# Patient Record
Sex: Female | Born: 1980 | Hispanic: Yes | Marital: Married | Smoking: Never smoker
Health system: Southern US, Community
[De-identification: ages and names within clinical notes are randomized; demographics above are authoritative.]

## PROBLEM LIST (undated history)

## (undated) DIAGNOSIS — Z789 Other specified health status: Secondary | ICD-10-CM

## (undated) DIAGNOSIS — O139 Gestational [pregnancy-induced] hypertension without significant proteinuria, unspecified trimester: Secondary | ICD-10-CM

## (undated) HISTORY — DX: Gestational (pregnancy-induced) hypertension without significant proteinuria, unspecified trimester: O13.9

---
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
## 2017-07-08 LAB — CYTOLOGY - PAP: PAP SMEAR: NEGATIVE

## 2017-09-26 ENCOUNTER — Ambulatory Visit: Payer: Self-pay | Admitting: *Deleted

## 2017-09-26 ENCOUNTER — Encounter: Payer: Self-pay | Admitting: General Practice

## 2017-09-26 ENCOUNTER — Other Ambulatory Visit: Payer: Self-pay

## 2017-09-26 VITALS — BP 124/69 | HR 69 | Temp 98.1°F | Ht 62.0 in | Wt 138.0 lb

## 2017-09-26 DIAGNOSIS — Z3200 Encounter for pregnancy test, result unknown: Secondary | ICD-10-CM

## 2017-09-26 DIAGNOSIS — Z3201 Encounter for pregnancy test, result positive: Secondary | ICD-10-CM

## 2017-09-26 LAB — POCT URINE PREGNANCY: Preg Test, Ur: POSITIVE — AB

## 2017-09-26 NOTE — Progress Notes (Signed)
   Ms. Greeley presents today for UPT. She has no unusual complaints. LMP: 07/27/2017    OBJECTIVE: Appears well, in no apparent distress.  OB History    Gravida  3   Para  2   Term  2   Preterm      AB      Living  2     SAB      TAB      Ectopic      Multiple      Live Births  2          Home UPT Result: Positive In-Office UPT result:  Postive I have reviewed the patient's medical, obstetrical, social, and family histories, and medications.   ASSESSMENT: Positive pregnancy test  PLAN Prenatal care to be completed at: [facility]  [place], [place], [place]

## 2017-10-23 ENCOUNTER — Other Ambulatory Visit (HOSPITAL_COMMUNITY): Payer: Self-pay | Admitting: Nurse Practitioner

## 2017-10-23 DIAGNOSIS — Z369 Encounter for antenatal screening, unspecified: Secondary | ICD-10-CM

## 2017-10-23 LAB — OB RESULTS CONSOLE RUBELLA ANTIBODY, IGM: RUBELLA: IMMUNE

## 2017-10-23 LAB — OB RESULTS CONSOLE PLATELET COUNT: PLATELETS: 231

## 2017-10-23 LAB — OB RESULTS CONSOLE HEPATITIS B SURFACE ANTIGEN: HEP B S AG: NEGATIVE

## 2017-10-23 LAB — OB RESULTS CONSOLE RPR: RPR: NONREACTIVE

## 2017-10-23 LAB — OB RESULTS CONSOLE HGB/HCT, BLOOD
HCT: 41 (ref 29–41)
Hemoglobin: 13.7

## 2017-10-23 LAB — OB RESULTS CONSOLE ABO/RH: RH Type: POSITIVE

## 2017-10-23 LAB — OB RESULTS CONSOLE ANTIBODY SCREEN: ANTIBODY SCREEN: NEGATIVE

## 2017-10-23 LAB — OB RESULTS CONSOLE HIV ANTIBODY (ROUTINE TESTING): HIV: NONREACTIVE

## 2017-10-23 LAB — CYSTIC FIBROSIS DIAGNOSTIC STUDY: Interpretation-CFDNA:: NEGATIVE

## 2017-10-23 LAB — OB RESULTS CONSOLE VARICELLA ZOSTER ANTIBODY, IGG: VARICELLA IGG: IMMUNE

## 2017-10-23 LAB — OB RESULTS CONSOLE GC/CHLAMYDIA
Chlamydia: NEGATIVE
Gonorrhea: NEGATIVE

## 2017-10-23 LAB — CULTURE, OB URINE: URINE CULTURE, OB: NO GROWTH

## 2017-10-23 LAB — SICKLE CELL SCREEN: SICKLE CELL SCREEN: NORMAL

## 2017-10-24 ENCOUNTER — Encounter (HOSPITAL_COMMUNITY): Payer: Self-pay

## 2017-10-24 ENCOUNTER — Other Ambulatory Visit: Payer: Self-pay

## 2017-10-30 ENCOUNTER — Encounter (HOSPITAL_COMMUNITY): Payer: Self-pay | Admitting: *Deleted

## 2017-10-31 ENCOUNTER — Ambulatory Visit (HOSPITAL_COMMUNITY)
Admission: RE | Admit: 2017-10-31 | Discharge: 2017-10-31 | Disposition: A | Discharge status: Home / Self Care | Payer: Self-pay | Source: Ambulatory Visit | Attending: Nurse Practitioner | Admitting: Nurse Practitioner

## 2017-10-31 ENCOUNTER — Other Ambulatory Visit: Payer: Self-pay

## 2017-10-31 ENCOUNTER — Other Ambulatory Visit (HOSPITAL_COMMUNITY): Payer: Self-pay

## 2017-10-31 ENCOUNTER — Encounter (HOSPITAL_COMMUNITY): Payer: Self-pay

## 2017-10-31 ENCOUNTER — Ambulatory Visit (HOSPITAL_COMMUNITY)
Admission: RE | Admit: 2017-10-31 | Discharge: 2017-10-31 | Disposition: A | Discharge status: Home / Self Care | Payer: Self-pay | Source: Ambulatory Visit

## 2017-10-31 ENCOUNTER — Ambulatory Visit (HOSPITAL_COMMUNITY): Admission: RE | Admit: 2017-10-31 | Payer: Self-pay | Source: Ambulatory Visit

## 2017-10-31 DIAGNOSIS — Z3A13 13 weeks gestation of pregnancy: Secondary | ICD-10-CM | POA: Insufficient documentation

## 2017-10-31 DIAGNOSIS — Z369 Encounter for antenatal screening, unspecified: Secondary | ICD-10-CM

## 2017-10-31 DIAGNOSIS — O09521 Supervision of elderly multigravida, first trimester: Secondary | ICD-10-CM | POA: Insufficient documentation

## 2017-10-31 DIAGNOSIS — O34219 Maternal care for unspecified type scar from previous cesarean delivery: Secondary | ICD-10-CM | POA: Insufficient documentation

## 2017-10-31 DIAGNOSIS — O3621X0 Maternal care for hydrops fetalis, first trimester, not applicable or unspecified: Secondary | ICD-10-CM | POA: Insufficient documentation

## 2017-10-31 DIAGNOSIS — Z3682 Encounter for antenatal screening for nuchal translucency: Secondary | ICD-10-CM | POA: Insufficient documentation

## 2017-10-31 HISTORY — DX: Other specified health status: Z78.9

## 2017-11-03 ENCOUNTER — Other Ambulatory Visit (HOSPITAL_COMMUNITY): Payer: Self-pay | Admitting: *Deleted

## 2017-11-03 DIAGNOSIS — O358XX0 Maternal care for other (suspected) fetal abnormality and damage, not applicable or unspecified: Secondary | ICD-10-CM

## 2017-11-17 ENCOUNTER — Other Ambulatory Visit (HOSPITAL_COMMUNITY): Payer: Self-pay | Admitting: *Deleted

## 2017-11-17 ENCOUNTER — Ambulatory Visit (HOSPITAL_COMMUNITY)
Admission: RE | Admit: 2017-11-17 | Discharge: 2017-11-17 | Disposition: A | Discharge status: Home / Self Care | Payer: Self-pay | Source: Ambulatory Visit | Attending: Nurse Practitioner | Admitting: Nurse Practitioner

## 2017-11-17 ENCOUNTER — Encounter (HOSPITAL_COMMUNITY): Payer: Self-pay

## 2017-11-17 DIAGNOSIS — O34219 Maternal care for unspecified type scar from previous cesarean delivery: Secondary | ICD-10-CM

## 2017-11-17 DIAGNOSIS — O358XX0 Maternal care for other (suspected) fetal abnormality and damage, not applicable or unspecified: Secondary | ICD-10-CM

## 2017-11-17 DIAGNOSIS — O289 Unspecified abnormal findings on antenatal screening of mother: Secondary | ICD-10-CM

## 2017-11-17 DIAGNOSIS — O09522 Supervision of elderly multigravida, second trimester: Secondary | ICD-10-CM

## 2017-11-17 DIAGNOSIS — Z3A16 16 weeks gestation of pregnancy: Secondary | ICD-10-CM

## 2017-11-17 DIAGNOSIS — D181 Lymphangioma, any site: Secondary | ICD-10-CM | POA: Insufficient documentation

## 2017-11-19 ENCOUNTER — Other Ambulatory Visit (HOSPITAL_COMMUNITY): Payer: Self-pay

## 2017-11-19 ENCOUNTER — Telehealth (HOSPITAL_COMMUNITY): Payer: Self-pay | Admitting: Obstetrics and Gynecology

## 2017-11-19 NOTE — Telephone Encounter (Signed)
Called the patient and informed the FISH results of amniocentesis.  FISH: Abnormal, Trisomy 18.  I informed the patient of the significance of FISH results and that only final culture results are confirmatory. Decision to terminate the pregnancy should be, preferably, made after final culture results.  Patient was very upset after hearing the news. We will contact her after final results.

## 2017-11-25 ENCOUNTER — Other Ambulatory Visit (HOSPITAL_COMMUNITY): Payer: Self-pay

## 2017-11-26 ENCOUNTER — Other Ambulatory Visit (HOSPITAL_COMMUNITY): Payer: Self-pay

## 2017-12-09 ENCOUNTER — Ambulatory Visit (HOSPITAL_COMMUNITY)
Admission: RE | Admit: 2017-12-09 | Discharge: 2017-12-09 | Disposition: A | Discharge status: Home / Self Care | Payer: Self-pay | Source: Ambulatory Visit | Attending: Nurse Practitioner | Admitting: Nurse Practitioner

## 2017-12-12 ENCOUNTER — Ambulatory Visit (HOSPITAL_COMMUNITY)
Admission: RE | Admit: 2017-12-12 | Discharge: 2017-12-12 | Disposition: A | Discharge status: Home / Self Care | Payer: Self-pay | Source: Ambulatory Visit | Attending: Nurse Practitioner | Admitting: Nurse Practitioner

## 2017-12-12 ENCOUNTER — Ambulatory Visit (HOSPITAL_COMMUNITY): Payer: Self-pay

## 2017-12-12 ENCOUNTER — Encounter (HOSPITAL_COMMUNITY): Payer: Self-pay

## 2017-12-12 DIAGNOSIS — O351XX0 Maternal care for (suspected) chromosomal abnormality in fetus, not applicable or unspecified: Secondary | ICD-10-CM

## 2017-12-12 DIAGNOSIS — Z363 Encounter for antenatal screening for malformations: Secondary | ICD-10-CM

## 2017-12-12 DIAGNOSIS — Z3A18 18 weeks gestation of pregnancy: Secondary | ICD-10-CM | POA: Insufficient documentation

## 2017-12-12 DIAGNOSIS — O358XX0 Maternal care for other (suspected) fetal abnormality and damage, not applicable or unspecified: Secondary | ICD-10-CM | POA: Insufficient documentation

## 2017-12-12 DIAGNOSIS — O3621X0 Maternal care for hydrops fetalis, first trimester, not applicable or unspecified: Secondary | ICD-10-CM

## 2017-12-12 DIAGNOSIS — O34219 Maternal care for unspecified type scar from previous cesarean delivery: Secondary | ICD-10-CM

## 2017-12-12 DIAGNOSIS — O09521 Supervision of elderly multigravida, first trimester: Secondary | ICD-10-CM

## 2017-12-15 ENCOUNTER — Other Ambulatory Visit (HOSPITAL_COMMUNITY): Payer: Self-pay | Admitting: *Deleted

## 2017-12-15 DIAGNOSIS — O3512X0 Maternal care for (suspected) chromosomal abnormality in fetus, trisomy 18, not applicable or unspecified: Secondary | ICD-10-CM

## 2017-12-15 DIAGNOSIS — O351XX0 Maternal care for (suspected) chromosomal abnormality in fetus, not applicable or unspecified: Secondary | ICD-10-CM

## 2017-12-22 ENCOUNTER — Ambulatory Visit (INDEPENDENT_AMBULATORY_CARE_PROVIDER_SITE_OTHER): Payer: Self-pay | Admitting: Family Medicine

## 2017-12-22 ENCOUNTER — Encounter: Payer: Self-pay | Admitting: Family Medicine

## 2017-12-22 VITALS — BP 114/66 | HR 63 | Wt 149.4 lb

## 2017-12-22 DIAGNOSIS — O09529 Supervision of elderly multigravida, unspecified trimester: Secondary | ICD-10-CM | POA: Insufficient documentation

## 2017-12-22 DIAGNOSIS — O0992 Supervision of high risk pregnancy, unspecified, second trimester: Secondary | ICD-10-CM

## 2017-12-22 DIAGNOSIS — O34219 Maternal care for unspecified type scar from previous cesarean delivery: Secondary | ICD-10-CM | POA: Insufficient documentation

## 2017-12-22 DIAGNOSIS — O351XX0 Maternal care for (suspected) chromosomal abnormality in fetus, not applicable or unspecified: Secondary | ICD-10-CM

## 2017-12-22 DIAGNOSIS — O09522 Supervision of elderly multigravida, second trimester: Secondary | ICD-10-CM

## 2017-12-22 DIAGNOSIS — Z3A19 19 weeks gestation of pregnancy: Secondary | ICD-10-CM

## 2017-12-22 DIAGNOSIS — O0990 Supervision of high risk pregnancy, unspecified, unspecified trimester: Secondary | ICD-10-CM | POA: Insufficient documentation

## 2017-12-22 DIAGNOSIS — O3512X0 Maternal care for (suspected) chromosomal abnormality in fetus, trisomy 18, not applicable or unspecified: Secondary | ICD-10-CM | POA: Insufficient documentation

## 2017-12-22 LAB — POCT URINALYSIS DIP (DEVICE)
Bilirubin Urine: NEGATIVE
GLUCOSE, UA: NEGATIVE mg/dL
HGB URINE DIPSTICK: NEGATIVE
KETONES UR: NEGATIVE mg/dL
Leukocytes, UA: NEGATIVE
Nitrite: NEGATIVE
PH: 7 (ref 5.0–8.0)
Protein, ur: NEGATIVE mg/dL
SPECIFIC GRAVITY, URINE: 1.02 (ref 1.005–1.030)
UROBILINOGEN UA: 0.2 mg/dL (ref 0.0–1.0)

## 2017-12-22 NOTE — Progress Notes (Signed)
Subjective:  Brittany Knapp is a F7P1025 [redacted]w[redacted]d being seen today for her first obstetrical visit. She had prenatal care at [facility]. She had a nuchal translucency that showed a cystic hygroma. An amniocentesis was done, showing T18.  Patient referred here for continuation of her prenatal care. Her obstetrical history is significant for advanced maternal age, history of c/s due to macrosomia, successful vbac. Pregnancy history fully reviewed.  Patient reports no complaints.  BP 114/66   Pulse 63   Wt 149 lb 6.4 oz (67.8 kg)   LMP 07/27/2017 (Exact Date)   BMI 27.33 kg/m   HISTORY: OB History  Gravida Para Term Preterm AB Living  3 2 2     2   SAB TAB Ectopic Multiple Live Births          2    # Outcome Date GA Lbr Len/2nd Weight Sex Delivery Anes PTL Lv  3 Current           2 Term 01/31/99   8 lb 8 oz (3.856 kg) M Vag-Spont  N LIV  1 Term 01/13/98   8 lb 13.1 oz (4 kg) M   N LIV    Past Medical History:  Diagnosis Date  . Medical history non-contributory     Past Surgical History:  Procedure Laterality Date  . CESAREAN SECTION      Family History  Problem Relation Age of Onset  . Hyperlipidemia Mother   . Diabetes Father   . Diabetes Sister   . Diabetes Brother      Exam  BP 114/66   Pulse 63   Wt 149 lb 6.4 oz (67.8 kg)   LMP 07/27/2017 (Exact Date)   BMI 27.33 kg/m   CONSTITUTIONAL: Well-developed, well-nourished female in no acute distress.  HENT:  Normocephalic, atraumatic, External right and left ear normal. Oropharynx is clear and moist EYES: Conjunctivae and EOM are normal. Pupils are equal, round, and reactive to light. No scleral icterus.  NECK: Normal range of motion, supple, no masses.  Normal thyroid.  CARDIOVASCULAR: Normal heart rate noted, regular rhythm RESPIRATORY: Clear to auscultation bilaterally. Effort and breath sounds normal, no problems with respiration noted. BREASTS: Symmetric in size. No masses, skin changes, nipple drainage, or  lymphadenopathy. ABDOMEN: Soft, normal bowel sounds, no distention noted.  No tenderness, rebound or guarding.  PELVIC: Normal appearing external genitalia; normal appearing vaginal mucosa and cervix. No abnormal discharge noted. Normal uterine size, no other palpable masses, no uterine or adnexal tenderness. MUSCULOSKELETAL: Normal range of motion. No tenderness.  No cyanosis, clubbing, or edema.  2+ distal pulses. SKIN: Skin is warm and dry. No rash noted. Not diaphoretic. No erythema. No pallor. NEUROLOGIC: Alert and oriented to person, place, and time. Normal reflexes, muscle tone coordination. No cranial nerve deficit noted. PSYCHIATRIC: Normal mood and affect. Normal behavior. Normal judgment and thought content.    Assessment:    Pregnancy: [id number] Patient Active Problem List   Diagnosis Date Noted  . Supervision of high risk pregnancy, antepartum 12/22/2017  . History of cesarean delivery affecting pregnancy 12/22/2017  . AMA (advanced maternal age) multigravida 24+, unspecified trimester 12/22/2017  . Trisomy 18 of fetus, current pregnancy, single gestation 12/22/2017      Plan:   1. Supervision of high risk pregnancy, antepartum FHT and FH normal  2. Trisomy 18 of fetus, current pregnancy, single gestation I discussed termination of pregnancy with patient - she would like to continue with pregnancy care. We discussed that about half pregnancy end  in IUFD and 62 of babies die within 49 hours of birth. Has large VSD - fetal echo already scheduled. Continue with growth [place] q 4 weeks. Discussed referral to [facility] for palliative care - referral placed.  3. AMA (advanced maternal age) multigravida 35+, unspecified trimester Daily asa 81mg   4. History of cesarean delivery affecting pregnancy Would like to deliver vaginally if possible     Problem list reviewed and updated. 75% of 45 min visit spent on counseling and coordination of care.     Truett Mainland 12/22/2017

## 2017-12-22 NOTE — Progress Notes (Signed)
New OB Packet given.

## 2017-12-24 ENCOUNTER — Encounter: Payer: Self-pay | Admitting: Emergency Medicine

## 2017-12-24 DIAGNOSIS — O0990 Supervision of high risk pregnancy, unspecified, unspecified trimester: Secondary | ICD-10-CM

## 2018-01-15 ENCOUNTER — Ambulatory Visit (HOSPITAL_COMMUNITY)
Admission: RE | Admit: 2018-01-15 | Discharge: 2018-01-15 | Disposition: A | Discharge status: Home / Self Care | Payer: Self-pay | Source: Ambulatory Visit | Attending: Nurse Practitioner | Admitting: Nurse Practitioner

## 2018-01-15 ENCOUNTER — Other Ambulatory Visit (HOSPITAL_COMMUNITY): Payer: Self-pay | Admitting: Maternal and Fetal Medicine

## 2018-01-15 ENCOUNTER — Other Ambulatory Visit: Payer: Self-pay | Admitting: Obstetrics and Gynecology

## 2018-01-15 ENCOUNTER — Other Ambulatory Visit (HOSPITAL_COMMUNITY): Payer: Self-pay | Admitting: *Deleted

## 2018-01-15 ENCOUNTER — Encounter (HOSPITAL_COMMUNITY): Payer: Self-pay

## 2018-01-15 DIAGNOSIS — O3621X0 Maternal care for hydrops fetalis, first trimester, not applicable or unspecified: Secondary | ICD-10-CM | POA: Insufficient documentation

## 2018-01-15 DIAGNOSIS — O34219 Maternal care for unspecified type scar from previous cesarean delivery: Secondary | ICD-10-CM | POA: Insufficient documentation

## 2018-01-15 DIAGNOSIS — O351XX0 Maternal care for (suspected) chromosomal abnormality in fetus, not applicable or unspecified: Secondary | ICD-10-CM | POA: Insufficient documentation

## 2018-01-15 DIAGNOSIS — Z3A23 23 weeks gestation of pregnancy: Secondary | ICD-10-CM | POA: Insufficient documentation

## 2018-01-15 DIAGNOSIS — O3512X0 Maternal care for (suspected) chromosomal abnormality in fetus, trisomy 18, not applicable or unspecified: Secondary | ICD-10-CM

## 2018-01-15 DIAGNOSIS — O09522 Supervision of elderly multigravida, second trimester: Secondary | ICD-10-CM | POA: Insufficient documentation

## 2018-01-15 DIAGNOSIS — O358XX0 Maternal care for other (suspected) fetal abnormality and damage, not applicable or unspecified: Secondary | ICD-10-CM | POA: Insufficient documentation

## 2018-01-15 DIAGNOSIS — Z362 Encounter for other antenatal screening follow-up: Secondary | ICD-10-CM | POA: Insufficient documentation

## 2018-01-15 DIAGNOSIS — O289 Unspecified abnormal findings on antenatal screening of mother: Secondary | ICD-10-CM | POA: Insufficient documentation

## 2018-01-15 DIAGNOSIS — O359XX0 Maternal care for (suspected) fetal abnormality and damage, unspecified, not applicable or unspecified: Secondary | ICD-10-CM

## 2018-01-15 DIAGNOSIS — O26879 Cervical shortening, unspecified trimester: Secondary | ICD-10-CM

## 2018-01-15 MED ORDER — PROGESTERONE MICRONIZED 200 MG PO CAPS: ORAL_CAPSULE | ORAL | 3 refills | Status: DC

## 2018-01-19 ENCOUNTER — Encounter: Payer: Self-pay | Admitting: Family Medicine

## 2018-01-22 ENCOUNTER — Ambulatory Visit (INDEPENDENT_AMBULATORY_CARE_PROVIDER_SITE_OTHER): Payer: Self-pay | Admitting: Obstetrics and Gynecology

## 2018-01-22 ENCOUNTER — Encounter: Payer: Self-pay | Admitting: Obstetrics and Gynecology

## 2018-01-22 VITALS — BP 117/73 | HR 81 | Wt 154.0 lb

## 2018-01-22 DIAGNOSIS — O09522 Supervision of elderly multigravida, second trimester: Secondary | ICD-10-CM

## 2018-01-22 DIAGNOSIS — O34219 Maternal care for unspecified type scar from previous cesarean delivery: Secondary | ICD-10-CM

## 2018-01-22 DIAGNOSIS — O3512X0 Maternal care for (suspected) chromosomal abnormality in fetus, trisomy 18, not applicable or unspecified: Secondary | ICD-10-CM

## 2018-01-22 DIAGNOSIS — O26879 Cervical shortening, unspecified trimester: Secondary | ICD-10-CM | POA: Insufficient documentation

## 2018-01-22 DIAGNOSIS — O351XX0 Maternal care for (suspected) chromosomal abnormality in fetus, not applicable or unspecified: Secondary | ICD-10-CM

## 2018-01-22 DIAGNOSIS — Z3A24 24 weeks gestation of pregnancy: Secondary | ICD-10-CM

## 2018-01-22 DIAGNOSIS — O0992 Supervision of high risk pregnancy, unspecified, second trimester: Secondary | ICD-10-CM

## 2018-01-22 DIAGNOSIS — O0990 Supervision of high risk pregnancy, unspecified, unspecified trimester: Secondary | ICD-10-CM

## 2018-01-22 DIAGNOSIS — O09529 Supervision of elderly multigravida, unspecified trimester: Secondary | ICD-10-CM

## 2018-01-22 DIAGNOSIS — O26872 Cervical shortening, second trimester: Secondary | ICD-10-CM

## 2018-01-22 NOTE — Patient Instructions (Signed)

## 2018-01-22 NOTE — Progress Notes (Signed)
Subjective:  Brittany Knapp is a 37 y.o. G3P2002 at [redacted]w[redacted]d being seen today for ongoing prenatal care.  She is currently monitored for the following issues for this high-risk pregnancy and has Supervision of high risk pregnancy, antepartum; History of cesarean delivery affecting pregnancy; AMA (advanced maternal age) multigravida 35+, unspecified trimester; Trisomy 47 of fetus, current pregnancy, single gestation; and Cervical shortening on their problem list.  Patient reports no complaints.  Contractions: Not present. Vag. Bleeding: None.  Movement: Present. Denies leaking of fluid.   The following portions of the patient's history were reviewed and updated as appropriate: allergies, current medications, past family history, past medical history, past social history, past surgical history and problem list. Problem list updated.  Objective:   Vitals:   01/22/18 1511  BP: 117/73  Pulse: 81  Weight: 154 lb (69.9 kg)    Fetal Status: Fetal Heart Rate (bpm): 148   Movement: Present     General:  Alert, oriented and cooperative. Patient is in no acute distress.  Skin: Skin is warm and dry. No rash noted.   Cardiovascular: Normal heart rate noted  Respiratory: Normal respiratory effort, no problems with respiration noted  Abdomen: Soft, gravid, appropriate for gestational age. Pain/Pressure: Absent     Pelvic:  Cervical exam deferred        Extremities: Normal range of motion.  Edema: Trace  Mental Status: Normal mood and affect. Normal behavior. Normal judgment and thought content.   Urinalysis:      Assessment and Plan:  Pregnancy: G3P2002 at [redacted]w[redacted]d  1. Supervision of high risk pregnancy, antepartum Stable Glucola next OB visit  2. History of cesarean delivery affecting pregnancy Desires TOLAC Will need to sign papers at next visit  3. AMA (advanced maternal age) multigravida 100+, unspecified trimester Stable  4. Trisomy 18 of fetus, current pregnancy, single  gestation Staable  5. Cervical shortening in second trimester Stable on Vaginal Progesterone F/U CL ordered  Preterm labor symptoms and general obstetric precautions including but not limited to vaginal bleeding, contractions, leaking of fluid and fetal movement were reviewed in detail with the patient. Please refer to After Visit Summary for other counseling recommendations.  Return in about 4 weeks (around 02/19/2018) for OB visit.   Chancy Milroy, MD

## 2018-01-28 NOTE — L&D Delivery Note (Signed)
Delivery Note a non-viable fetus was delivered via Vaginal, Spontaneous (Presentation:cephalic ;  Placenta followed , cord left intact).  APGAR: 0, 0; weight pending .   Placenta status:intact , .  Cord: no nuchal with the following complications: none  Anesthesia:  epidural Episiotomy: None Lacerations:  None Suture Repair: no Est. Blood Loss (mL):  Less than 100 ml  Mom to postpartum.  Baby to [place].  Emeterio Reeve 03/17/2018, 4:10 AM

## 2018-02-05 ENCOUNTER — Encounter (HOSPITAL_COMMUNITY): Payer: Self-pay

## 2018-02-05 ENCOUNTER — Other Ambulatory Visit (HOSPITAL_COMMUNITY): Payer: Self-pay | Admitting: Maternal and Fetal Medicine

## 2018-02-05 ENCOUNTER — Ambulatory Visit (HOSPITAL_COMMUNITY)
Admission: RE | Admit: 2018-02-05 | Discharge: 2018-02-05 | Disposition: A | Discharge status: Home / Self Care | Payer: Self-pay | Source: Ambulatory Visit | Attending: Family Medicine | Admitting: Family Medicine

## 2018-02-05 ENCOUNTER — Other Ambulatory Visit (HOSPITAL_COMMUNITY): Payer: Self-pay | Admitting: *Deleted

## 2018-02-05 DIAGNOSIS — O3512X0 Maternal care for (suspected) chromosomal abnormality in fetus, trisomy 18, not applicable or unspecified: Secondary | ICD-10-CM

## 2018-02-05 DIAGNOSIS — Z3A26 26 weeks gestation of pregnancy: Secondary | ICD-10-CM

## 2018-02-05 DIAGNOSIS — O351XX0 Maternal care for (suspected) chromosomal abnormality in fetus, not applicable or unspecified: Secondary | ICD-10-CM | POA: Insufficient documentation

## 2018-02-05 DIAGNOSIS — O0990 Supervision of high risk pregnancy, unspecified, unspecified trimester: Secondary | ICD-10-CM | POA: Insufficient documentation

## 2018-02-05 DIAGNOSIS — O3621X0 Maternal care for hydrops fetalis, first trimester, not applicable or unspecified: Secondary | ICD-10-CM

## 2018-02-05 DIAGNOSIS — O09522 Supervision of elderly multigravida, second trimester: Secondary | ICD-10-CM

## 2018-02-05 DIAGNOSIS — O34219 Maternal care for unspecified type scar from previous cesarean delivery: Secondary | ICD-10-CM

## 2018-02-05 DIAGNOSIS — O26879 Cervical shortening, unspecified trimester: Secondary | ICD-10-CM

## 2018-02-05 DIAGNOSIS — O289 Unspecified abnormal findings on antenatal screening of mother: Secondary | ICD-10-CM

## 2018-02-09 NOTE — Consult Note (Signed)
Meeting with Herminio Heads on 02/09/2018 at 1400.  Meeting place [facility] [address]. [place], [place].  Lahari came to [place] for scheduled perinatal consultation with Marisue Ivan, SW and Soledad Gerlach available for spanish interpretation if needed. I was able to explain possible care needs for baby upon birth and support available for family if she were able to take baby home.  I showed Chela examples of an NG tube and a nasal cannula.  We discussed her understanding of the trisomy 18 diagnosis.  Discussed how [facility] can be of support for baby and parents if she were able to take baby home.

## 2018-02-09 NOTE — Social Work (Signed)
LCSW joined Brittany Knapp and Brittany Knapp, as well as a Brittany Knapp, Brittany Knapp at Brittany Brittany Knapp. Brittany Knapp was very tearful during our visit.  She was informed that Trisomy 62 has been confirmed.  LCSW provided emotional support and comfort to Brittany Knapp and Brittany Knapp.  Brittany Knapp did have questions about supports available should the Brittany Knapp die in utero as well as possibility if Brittany Knapp should survive beyond delivery.  LCSW gave Brittany Knapp basic information and informed Brittany that we can continue to discuss options while completing a "birth plan".  Knapp set for Monday 1/13 at [facility] to meet with LCSW, RN, and an interpreter.  Brittany Knapp is in agreement.

## 2018-02-16 ENCOUNTER — Encounter: Payer: Self-pay | Admitting: Family Medicine

## 2018-02-16 ENCOUNTER — Ambulatory Visit (INDEPENDENT_AMBULATORY_CARE_PROVIDER_SITE_OTHER): Payer: Self-pay | Admitting: Family Medicine

## 2018-02-16 VITALS — BP 114/72 | HR 79 | Wt 152.7 lb

## 2018-02-16 DIAGNOSIS — O0992 Supervision of high risk pregnancy, unspecified, second trimester: Secondary | ICD-10-CM

## 2018-02-16 DIAGNOSIS — O34219 Maternal care for unspecified type scar from previous cesarean delivery: Secondary | ICD-10-CM

## 2018-02-16 DIAGNOSIS — O3512X0 Maternal care for (suspected) chromosomal abnormality in fetus, trisomy 18, not applicable or unspecified: Secondary | ICD-10-CM

## 2018-02-16 DIAGNOSIS — O351XX0 Maternal care for (suspected) chromosomal abnormality in fetus, not applicable or unspecified: Secondary | ICD-10-CM

## 2018-02-16 DIAGNOSIS — O0990 Supervision of high risk pregnancy, unspecified, unspecified trimester: Secondary | ICD-10-CM

## 2018-02-16 DIAGNOSIS — Z3A27 27 weeks gestation of pregnancy: Secondary | ICD-10-CM

## 2018-02-16 DIAGNOSIS — O09529 Supervision of elderly multigravida, unspecified trimester: Secondary | ICD-10-CM

## 2018-02-16 DIAGNOSIS — Z23 Encounter for immunization: Secondary | ICD-10-CM

## 2018-02-16 DIAGNOSIS — O09522 Supervision of elderly multigravida, second trimester: Secondary | ICD-10-CM

## 2018-02-16 NOTE — Addendum Note (Signed)
Addended by: Diona Foley on: 02/16/2018 01:57 PM   Modules accepted: Orders

## 2018-02-16 NOTE — Progress Notes (Signed)
   PRENATAL VISIT NOTE  Subjective:  Brittany Knapp is a 38 y.o. G3P2002 at 79w5dbeing seen today for ongoing prenatal care.  She is currently monitored for the following issues for this high-risk pregnancy and has Supervision of high risk pregnancy, antepartum; History of cesarean delivery affecting pregnancy; AMA (advanced maternal age) multigravida 35+, unspecified trimester; Trisomy 125of fetus, current pregnancy, single gestation; and Cervical shortening on their problem list.  Patient reports no complaints.  Contractions: Not present. Vag. Bleeding: None.  Movement: Present. Denies leaking of fluid.   The following portions of the patient's history were reviewed and updated as appropriate: allergies, current medications, past family history, past medical history, past social history, past surgical history and problem list. Problem list updated.  Objective:   Vitals:   02/16/18 1325  BP: 114/72  Pulse: 79  Weight: 152 lb 11.2 oz (69.3 kg)    Fetal Status: Fetal Heart Rate (bpm): 139 Fundal Height: 26 cm Movement: Present     General:  Alert, oriented and cooperative. Patient is in no acute distress.  Skin: Skin is warm and dry. No rash noted.   Cardiovascular: Normal heart rate noted  Respiratory: Normal respiratory effort, no problems with respiration noted  Abdomen: Soft, gravid, appropriate for gestational age.  Pain/Pressure: Present     Pelvic: Cervical exam deferred        Extremities: Normal range of motion.  Edema: None  Mental Status: Normal mood and affect. Normal behavior. Normal judgment and thought content.   Assessment and Plan:  Pregnancy: G3P2002 at 254w5d1. Supervision of high risk pregnancy, antepartum FHT and FH normal. 28 week labs later this week. - Antibody screen; Future - CBC - Tdap vaccine greater than or equal to 7yo IM  2. Trisomy 18 of fetus, current pregnancy, single gestation Met with LCSW from [facility]. Has appt next week to arrange plan.     3. History of cesarean delivery affecting pregnancy TOLAC  4. AMA (advanced maternal age) multigravida 3532+unspecified trimester   Preterm labor symptoms and general obstetric precautions including but not limited to vaginal bleeding, contractions, leaking of fluid and fetal movement were reviewed in detail with the patient. Please refer to After Visit Summary for other counseling recommendations.  Return in about 4 weeks (around 03/16/2018).  Future Appointments  Date Time Provider [place]1/23/2020  8:50 AM WOC-WOCA LAB WOC-WOCA WOC  03/05/2018  3:00 PM WH[place]ole[place]orea [place]

## 2018-02-17 ENCOUNTER — Other Ambulatory Visit: Payer: Self-pay | Admitting: *Deleted

## 2018-02-17 DIAGNOSIS — O26872 Cervical shortening, second trimester: Secondary | ICD-10-CM

## 2018-02-17 DIAGNOSIS — O0990 Supervision of high risk pregnancy, unspecified, unspecified trimester: Secondary | ICD-10-CM

## 2018-02-18 ENCOUNTER — Other Ambulatory Visit: Payer: Self-pay

## 2018-02-18 DIAGNOSIS — O26872 Cervical shortening, second trimester: Secondary | ICD-10-CM

## 2018-02-18 DIAGNOSIS — O0990 Supervision of high risk pregnancy, unspecified, unspecified trimester: Secondary | ICD-10-CM

## 2018-02-19 ENCOUNTER — Other Ambulatory Visit: Payer: Self-pay

## 2018-02-19 LAB — CBC
Hematocrit: 36.3 % (ref 34.0–46.6)
Hemoglobin: 12.3 g/dL (ref 11.1–15.9)
MCH: 28.7 pg (ref 26.6–33.0)
MCHC: 33.9 g/dL (ref 31.5–35.7)
MCV: 85 fL (ref 79–97)
PLATELETS: 200 10*3/uL (ref 150–450)
RBC: 4.28 x10E6/uL (ref 3.77–5.28)
RDW: 13.1 % (ref 11.7–15.4)
WBC: 8.5 10*3/uL (ref 3.4–10.8)

## 2018-02-19 LAB — GLUCOSE TOLERANCE, 2 HOURS W/ 1HR
GLUCOSE, 1 HOUR: 153 mg/dL (ref 65–179)
Glucose, 2 hour: 111 mg/dL (ref 65–152)
Glucose, Fasting: 85 mg/dL (ref 65–91)

## 2018-02-19 LAB — HIV ANTIBODY (ROUTINE TESTING W REFLEX): HIV Screen 4th Generation wRfx: NONREACTIVE

## 2018-02-19 LAB — RPR: RPR Ser Ql: NONREACTIVE

## 2018-02-19 LAB — ANTIBODY SCREEN: Antibody Screen: NEGATIVE

## 2018-03-05 ENCOUNTER — Ambulatory Visit (HOSPITAL_COMMUNITY)
Admission: RE | Admit: 2018-03-05 | Discharge: 2018-03-05 | Disposition: A | Discharge status: Home / Self Care | Payer: Self-pay | Source: Ambulatory Visit | Attending: Family Medicine | Admitting: Family Medicine

## 2018-03-05 ENCOUNTER — Encounter (HOSPITAL_COMMUNITY): Payer: Self-pay

## 2018-03-05 DIAGNOSIS — O09522 Supervision of elderly multigravida, second trimester: Secondary | ICD-10-CM

## 2018-03-05 DIAGNOSIS — O289 Unspecified abnormal findings on antenatal screening of mother: Secondary | ICD-10-CM

## 2018-03-05 DIAGNOSIS — Z362 Encounter for other antenatal screening follow-up: Secondary | ICD-10-CM

## 2018-03-05 DIAGNOSIS — O0990 Supervision of high risk pregnancy, unspecified, unspecified trimester: Secondary | ICD-10-CM

## 2018-03-05 DIAGNOSIS — Z3A30 30 weeks gestation of pregnancy: Secondary | ICD-10-CM

## 2018-03-05 DIAGNOSIS — O351XX0 Maternal care for (suspected) chromosomal abnormality in fetus, not applicable or unspecified: Secondary | ICD-10-CM | POA: Insufficient documentation

## 2018-03-05 DIAGNOSIS — O34219 Maternal care for unspecified type scar from previous cesarean delivery: Secondary | ICD-10-CM

## 2018-03-05 DIAGNOSIS — O3623X0 Maternal care for hydrops fetalis, third trimester, not applicable or unspecified: Secondary | ICD-10-CM

## 2018-03-05 DIAGNOSIS — O359XX0 Maternal care for (suspected) fetal abnormality and damage, unspecified, not applicable or unspecified: Secondary | ICD-10-CM

## 2018-03-05 DIAGNOSIS — O3512X0 Maternal care for (suspected) chromosomal abnormality in fetus, trisomy 18, not applicable or unspecified: Secondary | ICD-10-CM

## 2018-03-06 ENCOUNTER — Other Ambulatory Visit (HOSPITAL_COMMUNITY): Payer: Self-pay | Admitting: *Deleted

## 2018-03-06 DIAGNOSIS — O351XX0 Maternal care for (suspected) chromosomal abnormality in fetus, not applicable or unspecified: Secondary | ICD-10-CM

## 2018-03-06 DIAGNOSIS — O3510X0 Maternal care for (suspected) chromosomal abnormality in fetus, unspecified, not applicable or unspecified: Secondary | ICD-10-CM

## 2018-03-16 ENCOUNTER — Inpatient Hospital Stay (HOSPITAL_COMMUNITY)
Admission: AD | Admit: 2018-03-16 | Discharge: 2018-03-17 | DRG: 807 | Disposition: A | Discharge status: Home / Self Care | Payer: Medicaid Other | Attending: Obstetrics & Gynecology | Admitting: Obstetrics & Gynecology

## 2018-03-16 ENCOUNTER — Encounter (HOSPITAL_COMMUNITY): Payer: Self-pay | Admitting: *Deleted

## 2018-03-16 ENCOUNTER — Other Ambulatory Visit: Payer: Self-pay

## 2018-03-16 ENCOUNTER — Ambulatory Visit (INDEPENDENT_AMBULATORY_CARE_PROVIDER_SITE_OTHER): Payer: Medicaid Other | Admitting: Family Medicine

## 2018-03-16 VITALS — BP 137/89 | HR 78 | Wt 155.1 lb

## 2018-03-16 DIAGNOSIS — O09523 Supervision of elderly multigravida, third trimester: Secondary | ICD-10-CM

## 2018-03-16 DIAGNOSIS — Z3A31 31 weeks gestation of pregnancy: Secondary | ICD-10-CM

## 2018-03-16 DIAGNOSIS — O0993 Supervision of high risk pregnancy, unspecified, third trimester: Secondary | ICD-10-CM

## 2018-03-16 DIAGNOSIS — O3512X0 Maternal care for (suspected) chromosomal abnormality in fetus, trisomy 18, not applicable or unspecified: Secondary | ICD-10-CM

## 2018-03-16 DIAGNOSIS — O34219 Maternal care for unspecified type scar from previous cesarean delivery: Secondary | ICD-10-CM | POA: Diagnosis present

## 2018-03-16 DIAGNOSIS — O364XX0 Maternal care for intrauterine death, not applicable or unspecified: Secondary | ICD-10-CM | POA: Diagnosis present

## 2018-03-16 DIAGNOSIS — Z3490 Encounter for supervision of normal pregnancy, unspecified, unspecified trimester: Secondary | ICD-10-CM

## 2018-03-16 DIAGNOSIS — O09529 Supervision of elderly multigravida, unspecified trimester: Secondary | ICD-10-CM

## 2018-03-16 DIAGNOSIS — O351XX0 Maternal care for (suspected) chromosomal abnormality in fetus, not applicable or unspecified: Secondary | ICD-10-CM | POA: Diagnosis present

## 2018-03-16 DIAGNOSIS — O0990 Supervision of high risk pregnancy, unspecified, unspecified trimester: Secondary | ICD-10-CM

## 2018-03-16 LAB — CBC
HEMATOCRIT: 40.7 % (ref 36.0–46.0)
Hemoglobin: 14 g/dL (ref 12.0–15.0)
MCH: 29 pg (ref 26.0–34.0)
MCHC: 34.4 g/dL (ref 30.0–36.0)
MCV: 84.3 fL (ref 80.0–100.0)
Platelets: 220 10*3/uL (ref 150–400)
RBC: 4.83 MIL/uL (ref 3.87–5.11)
RDW: 13.1 % (ref 11.5–15.5)
WBC: 11.8 10*3/uL — ABNORMAL HIGH (ref 4.0–10.5)
nRBC: 0 % (ref 0.0–0.2)

## 2018-03-16 LAB — TYPE AND SCREEN
ABO/RH(D): A POS
Antibody Screen: NEGATIVE

## 2018-03-16 MED ORDER — OXYTOCIN 40 UNITS IN NORMAL SALINE INFUSION - SIMPLE MED: 2.5000 [IU]/h | INTRAVENOUS | Status: DC

## 2018-03-16 MED ORDER — OXYCODONE-ACETAMINOPHEN 5-325 MG PO TABS: 1.0000 | ORAL_TABLET | ORAL | Status: DC | PRN

## 2018-03-16 MED ORDER — ACETAMINOPHEN 325 MG PO TABS
650.0000 mg | ORAL_TABLET | ORAL | Status: DC | PRN
  Administered 2018-03-17: 650 mg via ORAL
  Filled 2018-03-16: qty 2

## 2018-03-16 MED ORDER — ONDANSETRON HCL 4 MG/2ML IJ SOLN
4.0000 mg | Freq: Four times a day (QID) | INTRAMUSCULAR | Status: DC | PRN
  Administered 2018-03-17: 4 mg via INTRAVENOUS
  Filled 2018-03-16: qty 2

## 2018-03-16 MED ORDER — OXYTOCIN 40 UNITS IN NORMAL SALINE INFUSION - SIMPLE MED
1.0000 m[IU]/min | INTRAVENOUS | Status: DC
  Administered 2018-03-16: 1 m[IU]/min via INTRAVENOUS
  Filled 2018-03-16: qty 1000

## 2018-03-16 MED ORDER — LACTATED RINGERS IV SOLN
INTRAVENOUS | Status: DC
  Administered 2018-03-16 – 2018-03-17 (×2): via INTRAVENOUS

## 2018-03-16 MED ORDER — SOD CITRATE-CITRIC ACID 500-334 MG/5ML PO SOLN
30.0000 mL | ORAL | Status: DC | PRN
  Administered 2018-03-16: 30 mL via ORAL
  Filled 2018-03-16: qty 15

## 2018-03-16 MED ORDER — LIDOCAINE HCL (PF) 1 % IJ SOLN: 30.0000 mL | INTRAMUSCULAR | Status: DC | PRN

## 2018-03-16 MED ORDER — FENTANYL CITRATE (PF) 100 MCG/2ML IJ SOLN
100.0000 ug | INTRAMUSCULAR | Status: DC | PRN
  Administered 2018-03-16 – 2018-03-17 (×4): 100 ug via INTRAVENOUS
  Filled 2018-03-16 (×4): qty 2

## 2018-03-16 MED ORDER — OXYCODONE-ACETAMINOPHEN 5-325 MG PO TABS: 2.0000 | ORAL_TABLET | ORAL | Status: DC | PRN

## 2018-03-16 MED ORDER — OXYTOCIN BOLUS FROM INFUSION
500.0000 mL | Freq: Once | INTRAVENOUS | Status: AC
  Administered 2018-03-17: 500 mL via INTRAVENOUS

## 2018-03-16 NOTE — Progress Notes (Signed)
Brittany Knapp is a 38 y.o. G3P2002 at [redacted]w[redacted]d.  Subjective: Patient is resting comfortably in bed with no complaints or concerns at this time. She remains tearful but has family and support at bedside.  Objective: BP (!) 119/58 (BP Location: Right Arm)   Pulse 90   Temp 98.3 F (36.8 C) (Oral)   Resp 16   Wt 70.4 kg   LMP 07/27/2017 (Exact Date)   BMI 28.37 kg/m    Dilation: Fingertip Effacement (%): Thick Exam by:: Colman Cater, CNM  Assessment / Plan: [redacted]w[redacted]d week IUP Labor: latent Pain Control:  Analgesia/anesthesia prn TOLAC  Foley bulb in place, contin Pit. Plan for VBAC.  Nuala Alpha, DO 03/16/2018 11:33 PM

## 2018-03-16 NOTE — Progress Notes (Signed)
Labor Progress Note Brittany Knapp is a 38 y.o. G3P2002 at [redacted]w[redacted]d presented for IOL for IUFD  S:  Feeling intermittent LAP, unsure if ctx. No LOF or VB.  O:  BP 117/65   Pulse 84   Temp 98.2 F (36.8 C) (Oral)   Resp 20   Wt 70.4 kg   LMP 07/27/2017 (Exact Date)   BMI 28.37 kg/m  SVE: Dilation: Fingertip Effacement (%): Thick Exam by:: Colman Cater, CNM  A/P: 38 y.o. G3P2002 [redacted]w[redacted]d  1. Labor: latent 2. Pain: analgesia/anesthesia prn 3. TOLAC  Foley placed, tolerated well. Low dose Pitocin. Anticipate VBAC.  Julianne Handler, CNM 7:08 PM

## 2018-03-16 NOTE — Progress Notes (Signed)
   PRENATAL VISIT NOTE  Subjective:  Brittany Knapp is a 38 y.o. G3P2002 at [redacted]w[redacted]d being seen today for ongoing prenatal care.  She is currently monitored for the following issues for this high-risk pregnancy and has Supervision of high risk pregnancy, antepartum; History of cesarean delivery affecting pregnancy; AMA (advanced maternal age) multigravida 35+, unspecified trimester; Trisomy 73 of fetus, current pregnancy, single gestation; and Cervical shortening on their problem list.  Patient reports no fetal movement for 2 weeks.  Contractions: Irritability. Vag. Bleeding: None.  Movement: (!) Decreased. Denies leaking of fluid.   The following portions of the patient's history were reviewed and updated as appropriate: allergies, current medications, past family history, past medical history, past social history, past surgical history and problem list. Problem list updated.  Objective:   Vitals:   03/16/18 1542  BP: 137/89  Pulse: 78  Weight: 155 lb 1.6 oz (70.4 kg)    Fetal Status:     Movement: (!) Decreased     General:  Alert, oriented and cooperative. Patient is in no acute distress.  Skin: Skin is warm and dry. No rash noted.   Cardiovascular: Normal heart rate noted  Respiratory: Normal respiratory effort, no problems with respiration noted  Abdomen: Soft, gravid, appropriate for gestational age.  Pain/Pressure: Present     Pelvic: Cervical exam deferred        Extremities: Normal range of motion.  Edema: None  Mental Status: Normal mood and affect. Normal behavior. Normal judgment and thought content.   Assessment and Plan:  Pregnancy: G3P2002 at [redacted]w[redacted]d  1. Supervision of high risk pregnancy, antepartum  2. Trisomy 18 of fetus, current pregnancy, single gestation  3. History of cesarean delivery affecting pregnancy Successful VBAC.  4. AMA (advanced maternal age) multigravida 36+, unspecified trimester   5. IUFD at 69 weeks or more of gestation No cardiac activity  visualized on bedside US done by me. Patient quite tearful. Will induce. Discussed with Dr Roselie Awkward and L&D staff. Patient to go home to collect her things, then come in.   Please refer to After Visit Summary for other counseling recommendations.  No follow-ups on file.  Future Appointments  Date Time Provider [place]  03/19/2018 10:45 AM WH-MFC [place] 2 WH-MFCUS MFC-US    Truett Mainland, DO

## 2018-03-16 NOTE — MAU Note (Signed)
Pt was to be a direct admit.  Will take her to rm 175

## 2018-03-16 NOTE — H&P (Addendum)
OBSTETRIC ADMISSION HISTORY AND PHYSICAL  Brittany Knapp is a 38 y.o. female G75P2002 with IUP at [redacted]w[redacted]d presenting for IUFD. Seen in office today. No FM x1 week. IUFD confirmed by [place]. She is considering a BTL but has not signed papers. Pt prefers TOLAC vs RCS.  Dating: By 40 wk [place] --->  Estimated Date of Delivery: 05/13/18  Prenatal History/Complications: - J69 confirmed by amnio, VSD, multiple CPCs,  - AMA - previous CS and VBAC  Past Medical History: Past Medical History:  Diagnosis Date  . Medical history non-contributory   . Pregnancy induced hypertension 1999    Past Surgical History: Past Surgical History:  Procedure Laterality Date  . CESAREAN SECTION      Obstetrical History: OB History    Gravida  3   Para  2   Term  2   Preterm      AB      Living  2     SAB      TAB      Ectopic      Multiple      Live Births  2           Social History: Social History   Socioeconomic History  . Marital status: Single    Spouse name: Not on file  . Number of children: Not on file  . Years of education: Not on file  . Highest education level: Not on file  Occupational History  . Not on file  Social Needs  . Financial resource strain: Not on file  . Food insecurity:    Worry: Never true    Inability: Never true  . Transportation needs:    Medical: Not on file    Non-medical: Not on file  Tobacco Use  . Smoking status: Never Smoker  . Smokeless tobacco: Never Used  Substance and Sexual Activity  . Alcohol use: Never    Frequency: Never  . Drug use: Never  . Sexual activity: Yes    Birth control/protection: None  Lifestyle  . Physical activity:    Days per week: Not on file    Minutes per session: Not on file  . Stress: Not on file  Relationships  . Social connections:    Talks on phone: Not on file    Gets together: Not on file    Attends religious service: Not on file    Active member of club or organization: Not on file    Attends  meetings of clubs or organizations: Not on file    Relationship status: Not on file  Other Topics Concern  . Not on file  Social History Narrative  . Not on file    Family History: Family History  Problem Relation Age of Onset  . Hypertension Mother   . Diabetes Father   . Diabetes Sister   . Depression Sister   . Diabetes Brother   . Depression Brother    Allergies: No Known Allergies  Medications Prior to Admission  Medication Sig Dispense Refill Last Dose  . aspirin 81 MG tablet Take 81 mg by mouth daily.   Taking  . Prenatal Vit-Fe Fumarate-FA (MULTIVITAMIN-PRENATAL) 27-0.8 MG TABS tablet Take 1 tablet by mouth daily at 12 noon.   Taking  . progesterone (PROMETRIUM) 200 MG capsule Place one capsule vaginally at bedtime 30 capsule 3 Taking   Review of Systems   All systems reviewed and negative except as stated in HPI  Blood pressure 117/65, pulse 84, temperature 98.2  F (36.8 C), temperature source Oral, resp. rate 20, weight 70.4 kg, last menstrual period 07/27/2017. General appearance: alert, cooperative and no distress Lungs: regular rate and effort Heart: regular rate  Abdomen: soft, non-tender Extremities: Homans sign is negative, no sign of DVT Presentation: cephalic Dilation: Fingertip Effacement (%): Thick Exam by:: Colman Cater, CNM  Prenatal labs: ABO, Rh: A/Positive/-- (09/26 0000) Antibody: Negative (01/22 1002) Rubella: Immune (09/26 0000) RPR: Non Reactive (01/22 0915)  HBsAg: Negative (09/26 0000)  HIV: Non Reactive (01/22 0915)  GBS:  n/a 2 hr GTT normal  Prenatal Transfer Tool  Maternal Diabetes: No Genetic Screening: Abnormal:  Results: Trisomy 18 Maternal Ultrasounds/Referrals: Abnormal:  Findings:   Cardiac defect Fetal Ultrasounds or other Referrals:  Referred to Materal Fetal Medicine  Maternal Substance Abuse:  No Significant Maternal Medications:  None Significant Maternal Lab Results: None  No results found for this or any  previous visit (from the past 24 hour(s)).  Patient Active Problem List   Diagnosis Date Noted  . IUFD at 37 weeks or more of gestation 03/16/2018  . Cervical shortening 01/22/2018  . Supervision of high risk pregnancy, antepartum 12/22/2017  . History of cesarean delivery affecting pregnancy 12/22/2017  . AMA (advanced maternal age) multigravida 34+, unspecified trimester 12/22/2017  . Trisomy 18 of fetus, current pregnancy, single gestation 12/22/2017    Assessment: Brittany Knapp is a 38 y.o. G3P2002 at [redacted]w[redacted]d here for IOL for IUFD w/Trisomy 18  1. Labor: latent 2. Pain: analgesia/anesthesia prn   Plan: Admit to BS TOLAC- consent signed FB and low dose Pitocin   Julianne Handler, CNM  03/16/2018, 7:08 PM

## 2018-03-17 ENCOUNTER — Inpatient Hospital Stay (HOSPITAL_COMMUNITY): Payer: Medicaid Other | Admitting: Anesthesiology

## 2018-03-17 DIAGNOSIS — Z3490 Encounter for supervision of normal pregnancy, unspecified, unspecified trimester: Secondary | ICD-10-CM

## 2018-03-17 DIAGNOSIS — O364XX0 Maternal care for intrauterine death, not applicable or unspecified: Principal | ICD-10-CM

## 2018-03-17 DIAGNOSIS — O34219 Maternal care for unspecified type scar from previous cesarean delivery: Secondary | ICD-10-CM

## 2018-03-17 DIAGNOSIS — Z3A31 31 weeks gestation of pregnancy: Secondary | ICD-10-CM

## 2018-03-17 LAB — COMPREHENSIVE METABOLIC PANEL
ALT: 20 U/L (ref 0–44)
AST: 20 U/L (ref 15–41)
Albumin: 3.4 g/dL — ABNORMAL LOW (ref 3.5–5.0)
Alkaline Phosphatase: 75 U/L (ref 38–126)
Anion gap: 9 (ref 5–15)
BILIRUBIN TOTAL: 0.8 mg/dL (ref 0.3–1.2)
BUN: 12 mg/dL (ref 6–20)
CO2: 23 mmol/L (ref 22–32)
Calcium: 8.6 mg/dL — ABNORMAL LOW (ref 8.9–10.3)
Chloride: 103 mmol/L (ref 98–111)
Creatinine, Ser: 0.6 mg/dL (ref 0.44–1.00)
GFR calc Af Amer: >60 mL/min (ref >60)
GFR calc non Af Amer: >60 mL/min (ref >60)
Glucose, Bld: 105 mg/dL — ABNORMAL HIGH (ref 70–99)
Potassium: 3.5 mmol/L (ref 3.5–5.1)
Sodium: 135 mmol/L (ref 135–145)
Total Protein: 7.3 g/dL (ref 6.5–8.1)

## 2018-03-17 LAB — ABO/RH: ABO/RH(D): A POS

## 2018-03-17 LAB — FIBRINOGEN: Fibrinogen: 538 mg/dL — ABNORMAL HIGH (ref 210–475)

## 2018-03-17 LAB — PROTIME-INR
INR: 0.98
Prothrombin Time: 12.9 seconds (ref 11.4–15.2)

## 2018-03-17 LAB — APTT: aPTT: 25 seconds (ref 24–36)

## 2018-03-17 MED ORDER — FENTANYL 2.5 MCG/ML BUPIVACAINE 1/10 % EPIDURAL INFUSION (WH - ANES)
14.0000 mL/h | INTRAMUSCULAR | Status: DC | PRN
  Administered 2018-03-17: 14 mL/h via EPIDURAL
  Filled 2018-03-17: qty 100

## 2018-03-17 MED ORDER — PHENYLEPHRINE 40 MCG/ML (10ML) SYRINGE FOR IV PUSH (FOR BLOOD PRESSURE SUPPORT): 80.0000 ug | PREFILLED_SYRINGE | INTRAVENOUS | Status: DC | PRN

## 2018-03-17 MED ORDER — LACTATED RINGERS IV SOLN
500.0000 mL | Freq: Once | INTRAVENOUS | Status: AC
  Administered 2018-03-17: 500 mL via INTRAVENOUS

## 2018-03-17 MED ORDER — DIPHENHYDRAMINE HCL 50 MG/ML IJ SOLN: 12.5000 mg | INTRAMUSCULAR | Status: DC | PRN

## 2018-03-17 MED ORDER — LIDOCAINE HCL (PF) 1 % IJ SOLN
INTRAMUSCULAR | Status: DC | PRN
  Administered 2018-03-17 (×2): 5 mL via EPIDURAL

## 2018-03-17 MED ORDER — IBUPROFEN 600 MG PO TABS: 600.0000 mg | ORAL_TABLET | Freq: Three times a day (TID) | ORAL | 0 refills | Status: DC | PRN

## 2018-03-17 MED ORDER — EPHEDRINE 5 MG/ML INJ: 10.0000 mg | INTRAVENOUS | Status: DC | PRN

## 2018-03-17 MED ORDER — OXYTOCIN 40 UNITS IN NORMAL SALINE INFUSION - SIMPLE MED: 1.0000 m[IU]/min | INTRAVENOUS | Status: DC

## 2018-03-17 NOTE — Discharge Summary (Signed)
Postpartum Discharge Summary     Patient Name: Brittany Knapp DOB: 1981/01/10 MRN: [id number]  Date of admission: 03/16/2018 Delivering Provider: Woodroe Mode   Date of discharge: 03/17/2018  Admitting diagnosis: IUFD Intrauterine pregnancy: [redacted]w[redacted]d     Secondary diagnosis:  Principal Problem:   IUFD at 15 weeks or more of gestation Active Problems:   AMA (advanced maternal age) multigravida 85+, unspecified trimester   Trisomy 19 of fetus, current pregnancy, single gestation   VBAC, delivered, current hospitalization   Encounter for induction of labor  Additional problems: None     Discharge diagnosis: Preterm Pregnancy Delivered                                                                                                Post partum procedures:None  Augmentation: Pitocin and Foley Balloon  Complications: [facility] course:  Induction of Labor With Vaginal Delivery   38 y.o. yo G3P2002 at [redacted]w[redacted]d was admitted to the hospital 03/16/2018 for induction of labor.  Indication for induction: fetal demise.  Patient had an uncomplicated labor course as follows: Membrane Rupture Time/Date: 1:55 AM ,03/17/2018   Intrapartum Procedures: Episiotomy: None [1]                                         Lacerations:  None [1]  Patient had delivery of a Non Viable infant.  Information for the patient's newborn:  Sai, Moura [[id number]]     Details of delivery can be found in separate delivery note.  Patient had a routine postpartum course. Patient is discharged home 03/17/18.  Magnesium Sulfate recieved: No BMZ received: No  Physical exam  Vitals:   03/17/18 0432 03/17/18 0446 03/17/18 0601 03/17/18 0631  BP: (!) 107/48 (!) 97/53 (!) 90/55 (!) 93/55  Pulse: 86 78 78 84  Resp:  18 18 17   Temp:   98.2 F (36.8 C)   TempSrc:      SpO2:      Weight:      Height:       General: alert, cooperative and no distress Lochia: appropriate Uterine Fundus: firm Incision:  N/A DVT Evaluation: No evidence of DVT seen on physical exam. Labs: Lab Results  Component Value Date   WBC 11.8 (H) 03/16/2018   HGB 14.0 03/16/2018   HCT 40.7 03/16/2018   MCV 84.3 03/16/2018   PLT 220 03/16/2018   CMP Latest Ref Rng & Units 03/17/2018  Glucose 70 - 99 mg/dL 105(H)  BUN 6 - 20 mg/dL 12  Creatinine 0.44 - 1.00 mg/dL 0.60  Sodium 135 - 145 mmol/L 135  Potassium 3.5 - 5.1 mmol/L 3.5  Chloride 98 - 111 mmol/L 103  CO2 22 - 32 mmol/L 23  Calcium 8.9 - 10.3 mg/dL 8.6(L)  Total Protein 6.5 - 8.1 g/dL 7.3  Total Bilirubin 0.3 - 1.2 mg/dL 0.8  Alkaline Phos 38 - 126 U/L 75  AST 15 - 41 U/L 20  ALT 0 - 44 U/L 20  Discharge instruction: per After Visit Summary and "Baby and Me Booklet".  After visit meds:  Allergies as of 03/17/2018   No Known Allergies     Medication List    STOP taking these medications   aspirin 81 MG tablet   progesterone 200 MG capsule Commonly known as:  PROMETRIUM     TAKE these medications   ibuprofen 600 MG tablet Commonly known as:  ADVIL,MOTRIN Take 1 tablet (600 mg total) by mouth every 8 (eight) hours as needed.   multivitamin-prenatal 27-0.8 MG Tabs tablet Take 1 tablet by mouth daily at 12 noon.       Diet: routine diet  Activity: Advance as tolerated. Pelvic rest for 6 weeks.   Outpatient follow up:2 weeks Follow up Appt: Future Appointments  Date Time Provider [place]  03/19/2018 10:45 AM WH-MFC [place] 2 WH-MFCUS MFC-US  04/20/2018  3:35 PM Truett Mainland, DO WOC-WOCA WOC   Follow up Visit: [place] Follow up in 2 week(s).   Contact information: [place] [id number] [phone]         Please schedule this patient for Postpartum visit in: 2 weeks with the following provider: Any provider For C/S patients schedule nurse incision check in weeks 2 weeks: no High risk pregnancy complicated by: Trisomy 18 with  multiple anomalies  Delivery mode:  SVD Anticipated Birth Control:  other/unsure PP Procedures needed: mood check  Schedule Integrated [place] visit: yes  Newborn Data: Live born fetus  Birth Weight:   APGAR:0 , 0  Newborn Delivery   Birth date/time:   Delivery type:  Vaginal, Spontaneous     Baby Feeding: n/a Disposition:morgue   03/17/2018 Aura Camps, MD

## 2018-03-17 NOTE — Progress Notes (Addendum)
Chaplain and [facility] grief counselor, Denny Peon, met with patient and her cousin at her doctor's appointment to discuss her birth plan.  While pt was in her appointment, she learned that her baby's heart had stopped beating and that she would be induced to deliver this evening.  She was appropriately tearful and eager to get home to pack her bags to return to the hospital.  We encouraged her to take a few moments before leaving to absorb the news.  Brittany Knapp's partner works out of town, approximately 4 hours away, and she had not yet notified him of the news.  I informed her that spiritual care is available throughout the duration of her stay and offered space for her to begin processing her feelings.  Pt has already developed a strong relationship with Denny Peon, who will continue to offer support after discharge as well.  I brought the pt's birth plan to the labor and delivery unit and conveyed relevant information to the nursing team.  Please page as further needs arise.  Donald Prose. Elyn Peers, M.Div. [facility] Chaplain Pager [phone] Office [phone]

## 2018-03-17 NOTE — Anesthesia Preprocedure Evaluation (Signed)
Anesthesia Evaluation  Patient identified by MRN, date of birth, ID band Patient awake    Reviewed: Allergy & Precautions, H&P , NPO status , Patient's Chart, lab work & pertinent test results  History of Anesthesia Complications Negative for: history of anesthetic complications  Airway Mallampati: II  TM Distance: >3 FB Neck ROM: full    Dental no notable dental hx. (+) Teeth Intact   Pulmonary neg pulmonary ROS,    Pulmonary exam normal breath sounds clear to auscultation       Cardiovascular hypertension, Normal cardiovascular exam Rhythm:regular Rate:Normal     Neuro/Psych negative neurological ROS  negative psych ROS   GI/Hepatic negative GI ROS, Neg liver ROS,   Endo/Other  negative endocrine ROS  Renal/GU negative Renal ROS  negative genitourinary   Musculoskeletal   Abdominal   Peds  Hematology negative hematology ROS (+)   Anesthesia Other Findings IUFD at 20 weeks or more of gestation  Reproductive/Obstetrics (+) Pregnancy                             Anesthesia Physical Anesthesia Plan  ASA: II  Anesthesia Plan: Epidural   Post-op Pain Management:    Induction:   PONV Risk Score and Plan:   Airway Management Planned:   Additional Equipment:   Intra-op Plan:   Post-operative Plan:   Informed Consent: I have reviewed the patients History and Physical, chart, labs and discussed the procedure including the risks, benefits and alternatives for the proposed anesthesia with the patient or authorized representative who has indicated his/her understanding and acceptance.       Plan Discussed with:   Anesthesia Plan Comments:         Anesthesia Quick Evaluation

## 2018-03-17 NOTE — Progress Notes (Signed)
Attempted to follow up with patient, but she was sleeping.  Informed pt's nurse that I made contact with her yesterday at her appointment, but I was unable to chart until she had been readmitted.  Will continue to follow.

## 2018-03-17 NOTE — Anesthesia Procedure Notes (Signed)
Epidural Patient location during procedure: OB Start time: 03/17/2018 3:14 AM End time: 03/17/2018 3:24 AM  Staffing Anesthesiologist: Murvin Natal, MD Performed: anesthesiologist   Preanesthetic Checklist Completed: patient identified, site marked, pre-op evaluation, timeout performed, IV checked, risks and benefits discussed and monitors and equipment checked  Epidural Patient position: sitting Prep: DuraPrep Patient monitoring: heart rate, cardiac monitor, continuous pulse ox and blood pressure Approach: midline Location: L4-L5 Injection technique: LOR air  Needle:  Needle type: Tuohy  Needle gauge: 17 G Needle length: 9 cm Needle insertion depth: 6 cm Catheter type: closed end flexible Catheter size: 19 Gauge Catheter at skin depth: 11 cm Test dose: negative and Other  Assessment Events: blood not aspirated, injection not painful, no injection resistance and negative IV test  Additional Notes Informed consent obtained prior to proceeding including risk of failure, 1% risk of PDPH, risk of minor discomfort and bruising. Discussed alternatives to epidural analgesia and patient desires to proceed.  Timeout performed pre-procedure verifying patient name, procedure, and platelet count.  Patient tolerated procedure well. Reason for block:procedure for pain

## 2018-03-18 NOTE — Anesthesia Postprocedure Evaluation (Signed)
Anesthesia Post Note  Patient: Brittany Knapp  Procedure(s) Performed: AN AD HOC LABOR EPIDURAL     Patient location during evaluation: Mother Baby Anesthesia Type: Epidural Level of consciousness: awake and alert Pain management: pain level controlled Vital Signs Assessment: post-procedure vital signs reviewed and stable Respiratory status: spontaneous breathing, nonlabored ventilation and respiratory function stable Cardiovascular status: stable Postop Assessment: no headache, no backache and epidural receding Anesthetic complications: no    Last Vitals: There were no vitals filed for this visit.  Last Pain: There were no vitals filed for this visit.               Ryan P Ellender

## 2018-03-19 ENCOUNTER — Encounter (HOSPITAL_COMMUNITY): Payer: Self-pay

## 2018-03-19 ENCOUNTER — Ambulatory Visit (HOSPITAL_COMMUNITY): Admission: RE | Admit: 2018-03-19 | Payer: Self-pay | Source: Ambulatory Visit

## 2018-04-01 ENCOUNTER — Ambulatory Visit (INDEPENDENT_AMBULATORY_CARE_PROVIDER_SITE_OTHER): Payer: Self-pay | Admitting: Obstetrics & Gynecology

## 2018-04-01 ENCOUNTER — Encounter: Payer: Self-pay | Admitting: Obstetrics & Gynecology

## 2018-04-01 NOTE — Progress Notes (Signed)
Subjective:     Brittany Knapp is a 38 y.o. female who presents for a postpartum visit. She had demise of herfetus with tri 18 and was induced. She is 2 weeks postpartum following a spontaneous vaginal delivery. I have fully reviewed the prenatal and intrapartum course. The delivery was at 34 gestational weeks. Outcome: spontaneous vaginal delivery. Anesthesia: epidural. Postpartum course has been normal.  Bleeding moderate lochia. Bowel function is normal. Bladder function is normal. Patient is not sexually active. Contraception method is none. Postpartum depression screening: not done, she has counseling via [place].  The following portions of the patient's history were reviewed and updated as appropriate: allergies, current medications, past family history, past medical history, past social history, past surgical history and problem list.  Review of Systems Behavioral/Psych: positive for sleep disturbance   Objective:    BP 112/67   Pulse 62   Ht 5\' 3"  (1.6 m)   Wt 150 lb 14.4 oz (68.4 kg)   LMP 07/27/2017 (Exact Date)   Breastfeeding Unknown   BMI 26.73 kg/m   General:  alert, cooperative and no distress           Abdomen: soft, non-tender; bowel sounds normal; no masses,  no organomegaly   Vulva:  not evaluated  Vagina: not evaluated   Assessment:     normal postpartum exam. Pap smear not done at today's visit.  Sleep disturbance but does not request sleep aid medication  Plan:    1. Contraception: considering IUD, may go to [facility] for this 2. [facility] f/u  Woodroe Mode, MD 04/01/2018

## 2018-04-01 NOTE — Patient Instructions (Signed)
Eleccin del mtodo anticonceptivo Contraception Choices La anticoncepcin, o los mtodos anticonceptivos, son medidas que se toman o maneras a fin de intentar no quedar embarazada. Mtodo anticonceptivo hormonal Este tipo de mtodo anticonceptivo contiene hormonas. A continuacin se mencionan algunos tipos de mtodos anticonceptivos hormonales:  Un tubo que se coloca debajo de la piel del brazo (implante). El tubo puede permanecer en el lugar durante 3aos.  Inyecciones que debe recibir cada 3meses.  Pldoras que debe tomar todos los das (pldoras anticonceptivas).  Un parche que se debe cambiar 1vez por semana durante 3semanas (parche anticonceptivo). Despus de ese tiempo, el parche se debe retirar durante 1semana.  Un anillo que se coloca en la vagina. El anillo se deja colocado durante 3 semanas. Luego, se debe retirar de la vagina durante 1semana. Luego, se coloca un nuevo anillo en la vagina.  Pldoras que se deben tomar despus de tener sexo sin proteccin (pldoras anticonceptivas de emergencia). Mtodos anticonceptivos de barrera A continuacin se mencionan algunos tipos de mtodos anticonceptivos de barrera:  Una cubierta delgada que se coloca sobre el pene antes de tener sexo (preservativo masculino). La cubierta se desecha despus de tener sexo.  Una cubierta blanda y suelta que se coloca en la vagina antes de tener sexo (preservativo femenino). La cubierta se desecha despus de tener sexo.  Un dispositivo de goma que se aplica sobre el cuello uterino (diafragma). Este dispositivo debe fabricarse para usted. Se coloca en la vagina antes de tener sexo. Se debe dejar colocado durante 6 a 8horas despus de tener sexo. Se debe retirar en un plazo de 24horas.  Un capuchn pequeo y suave que se fija sobre el cuello uterino (capuchn cervical). Este capuchn debe fabricarse para usted. Se debe dejar colocado durante 6 a 8horas despus de tener sexo. Se debe retirar en un  plazo de 48horas.  Una esponja que se coloca en la vagina antes de tener sexo. Se debe dejar colocada durante al menos 6horas despus de tener sexo. Se debe retirar en un plazo de 30horas. Luego, debe desecharse.  Una sustancia qumica que destruye o impide que los espermatozoides ingresen al tero (espermicida). Se puede presentar en forma de pldora, crema, gel o espuma y se debe colocar en la vagina. La sustancia qumica se debe usar al menos de 10 a 15minutos antes de tener sexo. Dispositivo anticonceptivo intrauterino (DIU) El DIU es un pequeo dispositivo plstico en forma de T. Se coloca en el interior del tero. Existen dos tipos diferentes:  DIU hormonal. Este tipo puede permanecer colocado durante 3 a 5aos.  DIU de cobre. Este tipo puede permanecer colocado durante 10aos. Mtodos anticonceptivos permanentes A continuacin se mencionan algunos tipos de mtodos anticonceptivos permanentes:  Ciruga para obstruir las trompas de [place].  Colocacin de un dispositivo en cada una de las trompas de [place].  Ciruga para atar los conductos que transportan el esperma (vasectoma). Mtodos anticonceptivos por planificacin natural A continuacin se mencionan algunos mtodos anticonceptivos por planificacin natural:  No tener sexo en los das frtiles de la mujer.  Usar un calendario a fin de: ? Llevar un registro de la duracin de cada perodo. ? Determinar en qu das se podra producir el embarazo. ? Planificar no tener sexo en los das en que se podra producir el embarazo.  Reconocer los sntomas de la ovulacin y no tener sexo durante la ovulacin. Una manera en que la mujer puede detectar la ovulacin es controlarse la temperatura.  Esperar para tener sexo hasta despus de la   ovulacin. Resumen  La anticoncepcin, o los mtodos anticonceptivos, son medidas que se toman o [place] a fin de intentar no quedar [place].  Los mtodos anticonceptivos hormonales  incluyen implantes, inyecciones, pldoras, parches, anillos vaginales y pldoras anticonceptivas de Freight forward[employer].  Los mtodos anticonceptivos de barrera pueden incluir preservativos masculinos, preservativos femeninos, diafragmas, capuchones cervicales, esponjas y espermicidas.  [employer] tipos diferentes de DIU (dispositivo intrauterino) anticonceptivo. Un DIU puede colocarse en el tero de una mujer para evitar el embarazo durante 3 a 5 aos.  La esterilizacin permanente puede realizarse mediante un procedimiento para hombres, mujeres o ambos.  Los mtodos anticonceptivos por Marine scientist natural incluyen no tener [employer] frtiles de la [place]. Esta informacin no tiene Marine scientist el consejo del mdico. Asegrese de hacerle al mdico cualquier pregunta que tenga. Document Released: 05/01/2010 Document Revised: 09/17/2017 Document Reviewed: 09/04/2016 Elsevier Interactive Patient Education  2019 [employer].

## 2018-04-20 ENCOUNTER — Other Ambulatory Visit: Payer: Self-pay

## 2018-04-20 ENCOUNTER — Telehealth (INDEPENDENT_AMBULATORY_CARE_PROVIDER_SITE_OTHER): Payer: Self-pay | Admitting: Family Medicine

## 2018-04-20 NOTE — Progress Notes (Signed)
   TELEHEALTH VIRTUAL GYNECOLOGY VISIT ENCOUNTER NOTE  I connected with Brittany Knapp on 04/20/18 at  3:35 PM EDT by telephone and verified that I am speaking with the correct person using two identifiers.   I discussed the limitations, risks, security and privacy concerns of performing an evaluation and management service by telephone and the availability of in person appointments. I also discussed with the patient that there may be a patient responsible charge related to this service. The patient expressed understanding and agreed to proceed.   History:  Brittany Knapp is a 38 y.o. G82P2002 female being evaluated today for postpartum visit - she is 4 weeks postpartum and was seen 2 weeks ago. She denies any abnormal vaginal discharge, bleeding, pelvic pain or other concerns.     Plans on going to [facility] for contraception.   Past Medical History:  Diagnosis Date  . Medical history non-contributory   . Pregnancy induced hypertension 1999   Past Surgical History:  Procedure Laterality Date  . CESAREAN SECTION     The following portions of the patient's history were reviewed and updated as appropriate: allergies, current medications, past family history, past medical history, past social history, past surgical history and problem list.    Review of Systems:  Pertinent items noted in HPI and remainder of comprehensive ROS otherwise negative.  Physical Exam:  Physical exam deferred due to nature of the encounter  Labs and Imaging No results found for this or any previous visit (from the past 672 hour(s)). No results found.    Assessment and Plan:     There are no diagnoses linked to this encounter.  No follow-ups on file.     I discussed the assessment and treatment plan with the patient. The patient was provided an opportunity to ask questions and all were answered. The patient agreed with the plan and demonstrated an understanding of the instructions.   The patient was advised to call  back or seek an in-person evaluation/go to the ED if the symptoms worsen or if the condition fails to improve as anticipated.  I provided 12 minutes of non-face-to-face time during this encounter.   [place] for [employer], [place]

## 2021-03-06 ENCOUNTER — Ambulatory Visit: Payer: Self-pay | Admitting: Physician Assistant

## 2021-03-06 ENCOUNTER — Other Ambulatory Visit: Payer: Self-pay

## 2021-03-06 VITALS — BP 121/77 | HR 84 | Temp 98.2°F | Resp 18 | Ht 64.0 in | Wt 141.0 lb

## 2021-03-06 DIAGNOSIS — N912 Amenorrhea, unspecified: Secondary | ICD-10-CM

## 2021-03-06 DIAGNOSIS — Z3201 Encounter for pregnancy test, result positive: Secondary | ICD-10-CM

## 2021-03-06 NOTE — Progress Notes (Signed)
New Patient Office Visit  Subjective:  Patient ID: Brittany Knapp, female    DOB: [birth year]  Age: 41 y.o. MRN: [id number]  CC:  Chief Complaint  Patient presents with   Possible Pregnancy    HPI Brittany Knapp reports that she has taken two home pregnancy test which were positive , states that she is actively trying to become pregnant, LMP was 01/18/21.  Due to language barrier, an interpreter was present during the history-taking and subsequent discussion (and for part of the physical exam) with this patient.   Past Medical History:  Diagnosis Date   Medical history non-contributory    Pregnancy induced hypertension 1999    Past Surgical History:  Procedure Laterality Date   CESAREAN SECTION      Family History  Problem Relation Age of Onset   Hypertension Mother    Diabetes Father    Diabetes Sister    Depression Sister    Diabetes Brother    Depression Brother     Social History   Socioeconomic History   Marital status: Single    Spouse name: Not on file   Number of children: Not on file   Years of education: Not on file   Highest education level: Not on file  Occupational History   Not on file  Tobacco Use   Smoking status: Never   Smokeless tobacco: Never  Vaping Use   Vaping Use: Never used  Substance and Sexual Activity   Alcohol use: Never   Drug use: Never   Sexual activity: Yes    Birth control/protection: None  Other Topics Concern   Not on file  Social History Narrative   Not on file   Social Determinants of Health   Financial Resource Strain: Not on file  Food Insecurity: Not on file  Transportation Needs: Not on file  Physical Activity: Not on file  Stress: Not on file  Social Connections: Not on file  Intimate Partner Violence: Not on file    ROS Review of Systems  Constitutional: Negative.   HENT: Negative.    Eyes: Negative.   Respiratory:  Negative for shortness of breath.   Cardiovascular:  Negative for chest pain.   Gastrointestinal:  Negative for abdominal pain, nausea and vomiting.  Endocrine: Negative.   Genitourinary:  Negative for pelvic pain and vaginal bleeding.  Musculoskeletal: Negative.   Skin: Negative.   Allergic/Immunologic: Negative.   Neurological: Negative.   Hematological: Negative.   Psychiatric/Behavioral: Negative.     Objective:   Today's Vitals: BP 121/77 (BP Location: Left Arm, Patient Position: Sitting, Cuff Size: Normal)    Pulse 84    Temp 98.2 F (36.8 C) (Oral)    Resp 18    Ht 5\' 4"  (1.626 m)    Wt 141 lb (64 kg)    LMP 01/18/2021    SpO2 100%    BMI 24.20 kg/m   Physical Exam Vitals and nursing note reviewed.  Constitutional:      Appearance: Normal appearance.  HENT:     Head: Normocephalic and atraumatic.     Right Ear: External ear normal.     Left Ear: External ear normal.     Nose: Nose normal.     Mouth/Throat:     Mouth: Mucous membranes are moist.     Pharynx: Oropharynx is clear.  Eyes:     Extraocular Movements: Extraocular movements intact.     Conjunctiva/sclera: Conjunctivae normal.     Pupils: Pupils are equal, round, and  reactive to light.  Cardiovascular:     Rate and Rhythm: Normal rate and regular rhythm.     Pulses: Normal pulses.     Heart sounds: Normal heart sounds.  Pulmonary:     Effort: Pulmonary effort is normal.     Breath sounds: Normal breath sounds.  Musculoskeletal:        General: Normal range of motion.     Cervical back: Normal range of motion and neck supple.  Skin:    General: Skin is warm and dry.  Neurological:     General: No focal deficit present.     Mental Status: She is alert and oriented to person, place, and time.  Psychiatric:        Mood and Affect: Mood normal.        Behavior: Behavior normal.        Thought Content: Thought content normal.        Judgment: Judgment normal.    Assessment & Plan:   Problem List Items Addressed This Visit   None Visit Diagnoses     Amenorrhea    -  Primary    Relevant Orders   hCG, serum, qualitative       Outpatient Encounter Medications as of 03/06/2021  Medication Sig   ibuprofen (ADVIL,MOTRIN) 600 MG tablet Take 1 tablet (600 mg total) by mouth every 8 (eight) hours as needed.   Prenatal Vit-Fe Fumarate-FA (MULTIVITAMIN-PRENATAL) 27-0.8 MG TABS tablet Take 1 tablet by mouth daily at 12 noon.   No facility-administered encounter medications on file as of 03/06/2021.  1. Amenorrhea Will refer to [place] if positive.  Red flags given for prompt reevaluation - hCG, serum, qualitative   I have reviewed the patient's medical history (PMH, PSH, Social History, Family History, Medications, and allergies) , and have been updated if relevant. I spent 15 minutes reviewing chart and  face to face time with patient.    Follow-up: Return if symptoms worsen or fail to improve.   Brittany Grip Mayers, PA-C

## 2021-03-06 NOTE — Patient Instructions (Signed)
We will call you with your lab result.  [name], PA-C Physician Assistant [facility] Medicine [email or web address]

## 2021-03-08 ENCOUNTER — Encounter: Payer: Self-pay | Admitting: *Deleted

## 2021-03-08 ENCOUNTER — Telehealth: Payer: Self-pay | Admitting: *Deleted

## 2021-03-08 LAB — HCG, SERUM, QUALITATIVE: hCG,Beta Subunit,Qual,Serum: POSITIVE m[IU]/mL — AB (ref <6)

## 2021-03-08 NOTE — Addendum Note (Signed)
Addended by: [name] on: 03/08/2021 10:11 AM   Modules accepted: Orders

## 2021-03-08 NOTE — Telephone Encounter (Signed)
Medical Assistant used [employer] to contact patient.  Interpreter Name: Valarie Merino Interpreter #: [id number] MA UTR patient on any contacts listed. Communication letter has been sent.

## 2021-03-08 NOTE — Telephone Encounter (Signed)
-----   Message from [name], [place] sent at 03/08/2021 10:11 AM EST ----- Please call patient and let her know that her beta-hCG was positive, I will start a referral for her to be seen by women's health.

## 2021-04-05 ENCOUNTER — Ambulatory Visit (INDEPENDENT_AMBULATORY_CARE_PROVIDER_SITE_OTHER): Payer: Self-pay

## 2021-04-05 ENCOUNTER — Other Ambulatory Visit (HOSPITAL_COMMUNITY)
Admission: RE | Admit: 2021-04-05 | Discharge: 2021-04-05 | Disposition: A | Discharge status: Home / Self Care | Payer: Self-pay | Source: Ambulatory Visit | Attending: Family Medicine | Admitting: Family Medicine

## 2021-04-05 ENCOUNTER — Other Ambulatory Visit: Payer: Self-pay

## 2021-04-05 VITALS — BP 139/86 | HR 81 | Wt 149.1 lb

## 2021-04-05 DIAGNOSIS — O09521 Supervision of elderly multigravida, first trimester: Secondary | ICD-10-CM

## 2021-04-05 DIAGNOSIS — O0990 Supervision of high risk pregnancy, unspecified, unspecified trimester: Secondary | ICD-10-CM

## 2021-04-05 DIAGNOSIS — O34219 Maternal care for unspecified type scar from previous cesarean delivery: Secondary | ICD-10-CM

## 2021-04-05 DIAGNOSIS — R7303 Prediabetes: Secondary | ICD-10-CM

## 2021-04-05 DIAGNOSIS — O09529 Supervision of elderly multigravida, unspecified trimester: Secondary | ICD-10-CM

## 2021-04-05 DIAGNOSIS — O364XX0 Maternal care for intrauterine death, not applicable or unspecified: Secondary | ICD-10-CM

## 2021-04-05 NOTE — Progress Notes (Signed)
New OB Intake ? ?I connected with  Brittany Knapp on 04/05/21 at  2:15 PM EST by In Person Visit and verified that I am speaking with the correct person using two identifiers. Nurse is located at [facility] and pt is located at [id number]. ? ?I discussed the limitations, risks, security and privacy concerns of performing an evaluation and management service by telephone and the availability of in person appointments. I also discussed with the patient that there may be a patient responsible charge related to this service. The patient expressed understanding and agreed to proceed. ? ?I explained I am completing New OB Intake today. We discussed her EDD of 10/25/21 that is based on LMP of 01/18/21. Pt is G4/P2. I reviewed her allergies, medications, Medical/Surgical/OB history, and appropriate screenings. I informed her of [facility] services. Based on history, this is a/an  pregnancy complicated by hypertension .  ? ?Patient Active Problem List  ? Diagnosis Date Noted  ? Supervision of high risk pregnancy, antepartum 04/05/2021  ? AMA (advanced maternal age) multigravida 22+, first trimester 04/05/2021  ? ? ?Concerns addressed today ? ?Delivery Plans:  ?Pl[facility].  ? ?Waterbirth candidate? No ? ?MyChart/Babyscripts ?MyChart access verified. I explained pt will have some visits in office and some virtually. Babyscripts instructions given and order placed. Patient verifies receipt of registration text/e-mail. Account successfully created and app downloaded. ? ?Blood Pressure Cuff  ?Patient is self-pay; explained patient will be given BP cuff at first prenatal appt. Explained after first prenatal appt pt will check weekly and document in 60. ? ?Weight scale: Patient does / does not  have weight scale. Weight scale ordered for patient to pick up from [employer].  ? ?Anatomy US ?Explained first scheduled [place] will be around 19 weeks. Anatomy US scheduled for 04/01/21 at 1445. Pt notified to arrive at  1430. ?Scheduled AFP lab only appointment if CenteringPregnancy pt for same day as anatomy US.  ? ?Labs ?Discussed Johnsie Cancel genetic screening with patient. Would like both Panorama and Horizon drawn at new OB visit.Also if interested in genetic testing, tell patient she will need AFP 15-21 weeks to complete genetic testing .Routine prenatal labs needed. ? ?Covid Vaccine ?Patient has covid vaccine.  ? ?Is patient a CenteringPregnancy candidate? Not a candidate  ? ?Is patient a Mom+Baby Combined Care candidate? Not a candidate    ? ?Informed patient of [facility] website  and placed link in her AVS.  ? ?Social Determinants of Health ?Food Insecurity: Patient denies food insecurity. ?WIC Referral: Patient is interested in referral to [facility].  ?Transportation: Patient denies transportation needs. ?Childcare: Discussed no children allowed at ultrasound appointments. Offered childcare services; patient declines childcare services at this time. ? ?Send link to Pregnancy Navigators ? ? ?Placed OB Box on problem list and updated ? ?First visit review ?I reviewed new OB appt with pt. I explained she will have a pelvic exam, ob bloodwork with genetic screening, and PAP smear. Explained pt will be seen by Dr. Kennon Rounds at first visit; encounter routed to appropriate provider. Explained that patient will be seen by pregnancy navigator following visit with provider. [facility] information placed in AVS.  ? ?Bethanne Ginger, CMA ?04/05/2021  3:15 PM  ?

## 2021-04-05 NOTE — Patient Instructions (Addendum)
Safe Medications in Pregnancy   Acne:  Benzoyl Peroxide  Salicylic Acid   Backache/Headache:  Tylenol: 2 regular strength every 4 hours OR               2 Extra strength every 6 hours   Colds/Coughs/Allergies:  Benadryl (alcohol free) 25 mg every 6 hours as needed  Breath right strips  Claritin  Cepacol throat lozenges  Chloraseptic throat spray  Cold-Eeze- up to three times per day  Cough drops, alcohol free  Flonase (by prescription only)  Guaifenesin  Mucinex  Robitussin DM (plain only, alcohol free)  Saline nasal spray/drops  Sudafed (pseudoephedrine) & Actifed * use only after [redacted] weeks gestation and if you do not have high blood pressure  Tylenol  Vicks Vaporub  Zinc lozenges  Zyrtec   Constipation:  Colace  Ducolax suppositories  Fleet enema  Glycerin suppositories  Metamucil  Milk of magnesia  Miralax  Senokot  Smooth move tea   Diarrhea:  Kaopectate  Imodium A-D   *NO pepto Bismol   Hemorrhoids:  Anusol  Anusol HC  Preparation H  Tucks   Indigestion:  Tums  Maalox  Mylanta  Zantac  Pepcid   Insomnia:  Benadryl (alcohol free) '25mg'$  every 6 hours as needed  Tylenol PM  Unisom, no Gelcaps   Leg Cramps:  Tums  MagGel   Nausea/Vomiting:  Bonine  Dramamine  Emetrol  Ginger extract  Sea bands  Meclizine  Nausea medication to take during pregnancy:  Unisom (doxylamine succinate 25 mg tablets) Take one tablet daily at bedtime. If symptoms are not adequately controlled, the dose can be increased to a maximum recommended dose of two tablets daily (1/2 tablet in the morning, 1/2 tablet mid-afternoon and one at bedtime).  Vitamin B6 '100mg'$  tablets. Take one tablet twice a day (up to 200 mg per day).   Skin Rashes:  Aveeno products  Benadryl cream or '25mg'$  every 6 hours as needed  Calamine Lotion  1% cortisone cream   Yeast infection:  Gyne-lotrimin 7  Monistat 7    **If taking multiple medications, please check labels to avoid  duplicating the same active ingredients  **take medication as directed on the label  ** Do not exceed 4000 mg of tylenol in 24 hours  **Do not take medications that contain aspirin or ibuprofen   AREA PEDIATRIC/FAMILY [place] [phone]) [facility] Erin Hearing, MD; Gwendlyn Deutscher, MD; Walker Kehr, MD; Andria Frames, MD; McDiarmid, MD; Dutch Quint, MD; Nori Riis, MD; Mingo Amber, [address] [place], [place] [address] [phone] Mon-Fri 8:30-12:30, 1:30-5:00 Providers come to see babies at [place] [facility] [place] providers who accept newborns: Dorthy Cooler, MD; Orland Mustard, MD; Stephanie Acre, MD [address], [place], [place] [address] [phone] Mon-Fri 8:00-5:30 Babies seen by providers at [facility] Does NOT accept Medicaid Please call early in hospitalization for appointment (limited availability)  [place], MD [address] [place], [place] [address] [phone] Mon, Tue, Thur, Fri 8:30-5:00, Wed 10:00-7:00 (closed 1-2pm) Babies seen by [facility] providers Accepting Medicaid [place] [place], [place]. [place], [place], [place] [address] [phone] Mon-Fri 8:30-5:00, Sat 8:30-12:00 Provider comes to see babies at [facility] Accepting Medicaid Must have been referred from current patients or contacted office prior to delivery [place] for Child and [place] ([place] for [place]) Owens Shark, MD; Tamera Punt, MD; Doneen Poisson, MD; Fatima Sanger, MD; Wynetta Emery, MD; Jess Barters, MD;  Tami Ribas, MD; Herbert Moors, MD; Derrell Lolling, MD; Dorothyann Peng, MD; Lucious Groves, NP; Baldo Ash, NP [address], [place], [place] [address] [phone] Mon, [place] Poot, Thur, Fri 8:30-5:30, Wed 9:30-5:30, Sat 8:30-12:30 Babies seen by [facility] providers Accepting Medicaid Only accepting infants of first-time parents or siblings  of current patients Hospital discharge coordinator will make follow-up appointment Baltazar Najjar [address], [place], [place]  [address] [phone]   Fax - [facility] [place]. [address], [place], [place]  [address] Phone - [phone]   Fax - [place] [address], [place], [place], [place]  [address] [phone]  [place] [phone]) [facility] Redmond Baseman, MD; Jacklynn Ganong, MD; Torrie Mayers, MD; MD; Rosana Hoes, MD; Servando Salina, MD; Rose Fillers, MD; Rex Kras, MD; Corinna Capra, MD; Volney American, MD; Trilby Drummer, MD; Janann Colonel, MD; Jimmye Norman, [place] [address] [place] [address], [place], [place] [address] [phone] Mon-Fri 8:30-5:00 (extended evenings Mon-Thur as needed), Sat-Sun 10:00-1:00 Providers come to see babies at [facility] Accepting Medicaid for families of first-time babies and families with all children in the household age 85 and under. Must register with office prior to making appointment (M-F only). Buckman, NP; Tomi Bamberger, MD; Redmond School, MD; [place], [place] [address], [place], [place] [address] [phone] Mon-Fri 8:00-5:00 Babies seen by providers at [facility] Does NOT accept Medicaid/Commercial Insurance Only [facility] [place] ([facility])  Sabino Gasser, MD; Drema Dallas, MD; Montine Circle, MD; Vilma Prader, MD; Vanita Panda, MD; Alfonso Ramus, MD; Ruthann Cancer, MD; Roxanne Mins, MD; Rosalva Ferron, MD; Polly Cobia, MD [address] [place], [place] [address] [phone] Mon-Fri 8:30-5:30, Sat (Oct.-Mar.) 9:00-1:00 Babies seen by providers at [place] [phone]) Standard, MD; Suzan Slick, MD [address], [place], [place] [address] [phone] Mon-Fri 8:30-5:00, Sat 8:30-12:00 Providers come to see babies at [facility] Does NOT accept Medicaid [place] at Arlina Robes, [place]; Mannie Stabile, MD; [place], [place]; Nancy Fetter, MD;  Moreen Fowler, [place], [place], [place] [address] [phone] Mon-Fri 8:00-5:00 Babies seen by providers at [facility] Does NOT accept Medicaid Only accepting babies of parents who are patients Please call early in hospitalization for appointment (limited availability) [facility] Pediatricians Carlis Abbott, MD; Sharlene Motts, MD; Rod Can, MD; Warner Mccreedy, NP; Sabra Heck, MD; Ermalinda Memos, MD; Sharlett Iles, NP; Aurther Loft, MD; Jerrye Beavers, MD; Marcello Moores, MD; Berline Lopes, MD; Charolette Forward, MD [address] [place], [place], [place] [address] [phone] Mon-Fri 8:00-5:00, Sat 9:00-12:00 Providers come to see babies at [facility] Does NOT accept [facility] [phone]) [place] at [place] providers accepting new patients: Dayna Ramus, NP; Rolland Porter, [place], [place], [place] [address] [phone] Mon-Fri 8:00-5:00 Babies seen by providers at [facility] Does NOT accept Medicaid Only accepting babies of parents who are patients Please call early in hospitalization for appointment (limited availability) [facility] Abner Greenspan, MD; Sheran Lawless, MD [address] [place], [place] [address] [phone] (press 1 to schedule appointment) Mon-Fri 8:00-5:00 Providers come to see babies at [facility] Does NOT accept [facility], MD [address], [place], [place] [address] [phone] Mon-Fri 8:30-5:00 (lunch 12:30-1:00), extended hours by appointment only Wed 5:00-6:30 Babies seen by [facility] providers Accepting [facility] [place] at Nadeen Landau, MD; [place], MD; Ethlyn Gallery, [place], [place], [place] [address] [phone] Mon-Fri 8:00-5:00 Babies seen by [facility] providers Does NOT accept [facility] Coul[place] at [place], MD; [place], [place]; [place], [place] [address] [place], [place] [address] [phone] Mon-Fri 8:00-5:00 Babies  seen by [facility]  providers Does NOT accept [facility] [place], [place]; [place], [place]; [place], [place]; Albertina Parr, MD; Frederic Jericho, MD; Ronney Lion, MD; Carlos Levering, NP; Jerelene Redden, NP; Tomasita Crumble, NP; Ronelle Nigh, NP; Corinna Lines, MD; Earling, MD [address] [place], [place] [address] [phone] Mon-Fri 8:30-5:00, Sat 10:00-1:00 Providers come to see babies at [facility] Does NOT accept Medicaid Free prenatal information session Tuesdays at 4:45pm [place], MD; [place], [place]; [place], [place]; [place], [place]., [place] [address] [phone] Mon-Fri 7:30-5:30 Babies seen by [place] Doctor [address] [place], [place], [place]  [address] [phone]   Fax - [phone]  [place] [phone] & [phone]) [facility] [address] [address] [place], [place] [address] [phone] Mon-Thur 8:00-6:00 Providers come to see babies at [place], NP; Melford Aase, MD; [place], [place]; [place], [place] [address]., [employer], [place] [address] [phone] Mon-Thur 7:30-7:30, Fri 7:30-4:30 Babies seen by [facility] providers Accepting [facility] Carolynn Sayers, MD; Cristino Martes, NP; Gertie Baron, MD [place], [place], [place] [address] [phone] Mon-Fri 8:30-5:00, Sat 8:30-12:00 Providers come to see babies at [facility] Accepting Medicaid Must have Meet & Greet appointment at office prior to delivery [place] ([place]) Faythe Dingwall, MD; Juleen China, MD; Clydene Laming, [place] [address], [place], [place] [address] [phone] Mon-Wed 8:00-6:00, Thur-Fri 8:00-5:00, Sat 9:00-12:00 Providers come to see babies at [facility] Does NOT accept Medicaid Only accepting siblings of current patients [facility], [place], [place], [place]  [address] [phone]   Fax - [phone] [place] at [place]. [address] [place], [place]  [address] [phone]   Fax - [place] [phone] & [phone]) [place] at [facility], [place]; [place], [address] [place], [place] [address] [phone] Mon-Fri 7:00-5:00 Babies seen by [facility] providers Does NOT accept Medicaid [place], MD; [place], [place]; [place], [place] [address], [place], [place] [address] [phone] Mon-Fri 8:00-5:00 Babies seen by [facility] providers Accepting [facility] Vision [place], MD; [place], [place]; Novelty, NP; [place], [place] [address] [place], [place], [place] [address] [phone] Mon-Fri 8:00-5:00 Babies seen by providers at [place] [phone]) [facility] Primary [facility] at [place], [place] [address] Madelaine Bhat [place], [place] [address] [phone] Mon-Fri 8:00-5:00 Babies seen by [facility] providers Does NOT accept Medicaid Limited availability, please call early in hospitalization to schedule follow-up [facility] Kennedy Bucker, [place]; Maisie Fus, MD; Charlesetta Garibaldi, MD; [place], [place]; Jeannine Kitten, MD; [place], [address], [place], [place] [address] [phone] Mon-Fri 8:30-5:00, Sat 9:00-12:00 Babies seen by providers at [facility] Accepting Medicaid Please register online then schedule online or call office [email or web address] ([place] at [place]) Yong Channel, NP; Dwyane Dee, MD; Leonidas Romberg, [place] [address] [place], [place], [place] [address] [phone] Mon-Fri 8:00-5:00 Babies seen by providers at [place] ([facility]  [place]) [place], MD; Rayvon Char, NP; Melina Modena, MD [address] [place], [place], [place] [address] [phone] Mon-Fri 8:00-5:30, Sat&Sun by appointment (phones open at 8:30) Babies seen by [facility] providers Accepting Medicaid Must be a first-time baby or sibling of current  patient [place], [address], [place], [place]  [address] [phone]   Fax - [phone]  [place] [phone] & [phone]) [place], [place]; [place], [place]; [place], MD; [place], [place]; [place], MD [address], [place], [place] [address] [phone] Mon-Thur 8:00-7:00, Fri 8:00-5:00, Sat 8:00-12:00, Sun 9:00-12:00 Babies seen by [facility] providers Accepting Medicaid [facility] [place] at Mikey College, MD; Ruthann Cancer, MD; [facility], MD [address] [place], [place], [place] [address] [phone] Mon-Thur 8:00-5:00 Babies seen by providers at [facility] Accepting Medicaid [facility] [place] at Magdalene Molly, MD; Coe[place], MD; Amedeo Plenty, MD; Bobby Rumpf, MD; List, MD; Lavonia Drafts, MD; Ruthann Cancer, MD; Selinda Eon, MD; Audie Box, MD; Jim Like, MD; Christie Nottingham, MD; Hubbard Hartshorn, MD; Modena Nunnery, MD [address] Barbara Cower [place], [place] [address] [phone] Mon-Fri 8:00-5:30, Sat (Oct.-Mar.) 9:00-1:00 Babies seen by providers at [facility] Accepting Medicaid Must fill out new patient packet, available online at [email or web address] [place] ([facility]) Fredderick Severance, NP; Kenton Kingfisher, NP; Claiborne Billings, NP; Rolla Plate, MD; [place], [place]; Carola Rhine, MD; [place], MD; Delia Chimes, NP [address], [place], [place] [address] [phone] Mon-Thur 8:00-5:30, Fri 8:00-5:00 Babies seen by providers at [place] [phone]) [place], [place]; Sumner, MD; Robinson, MD; [place], [place] [address] [place], [place], [place] [address] [phone] Mon-Fri 8:00-5:00 Babies seen by providers at [place] [phone]) [place] at [facility], [place]; Olen Pel, MD; [place], [address], [place], [place] [address] [phone] Mon-Fri 8:00-5:00 Babies seen by providers at [facility] Does NOT accept Medicaid Limited appointment availability, please call early in hospitalization  [place] at [facility] [place], [place]; [place], MD [address], [place], [place] [address] [phone] Mon-Fri 8:00-5:00 Babies seen by [facility] providers Does NOT accept Medicaid [place], MD; Guy Sandifer, MD; [place], [place]; [place], [address], [place], [place] [address] [phone] Mon-Fri 8:00-5:00 After hours clinic [facility] [place], [place] [address]) [phone] Mon-Fri 5:00-8:00, Sat 12:00-6:00, Sun 10:00-4:00 Babies seen by [facility] providers Accepting Medicaid [place] at [facility] [address], [place], [place]  [address] [phone]   Fax - [phone]  [place] [phone]) [place] at [facility], [address] [place], [place], [place] [address] [phone] Mon-Fri 8:00-5:00 Babies seen by [facility] providers Does NOT accept Medicaid [place] ([place] at [place]) Daron Offer, [place] [address], [place], [place] [address] [phone] Mon-Thur 8:00-7:00, Fri 8:00-5:00, Sat 8:00-12:00 Babies seen by providers at [facility] Accepting Medicaid - but does not have vaccinations in office (must be received elsewhere) Limited availability, please call early in hospitalization  [place] [phone]) [place], MD [address] [place] [address] [phone]  Fax [phone]  [facility]  Lauretta Chester, MD, [place], [place], [place], [place], [place], [place] [address] [facility]  [place], [place], [place] [address] [phone] [address], [place], [place] [address] [phone] ([place])  [facility] [address], [place], [place] [address] [place], [place], [place] [address] [phone] [place] [phone]  [address], [place], [place] [address] [place] [address], [place], [place] [address] [place], [place], [place] [address] [facility] [address], [place], [place] [address] [phone] [place]. [address], [place], [place] [address] [phone] Dr. Okey Reg[address]. [place] [address] [place], [place] [address] [facility] [address], [place] [address], [place], [place] [address] [facility] [address] [place], [place] [address] [phone]

## 2021-04-06 ENCOUNTER — Telehealth: Payer: Self-pay | Admitting: *Deleted

## 2021-04-06 DIAGNOSIS — R7303 Prediabetes: Secondary | ICD-10-CM | POA: Insufficient documentation

## 2021-04-06 DIAGNOSIS — O99810 Abnormal glucose complicating pregnancy: Secondary | ICD-10-CM

## 2021-04-06 LAB — COMPREHENSIVE METABOLIC PANEL
ALT: 12 IU/L (ref 0–32)
AST: 16 IU/L (ref 0–40)
Albumin/Globulin Ratio: 1.6 (ref 1.2–2.2)
Albumin: 4.5 g/dL (ref 3.8–4.8)
Alkaline Phosphatase: 72 IU/L (ref 44–121)
BUN/Creatinine Ratio: 16 (ref 9–23)
BUN: 11 mg/dL (ref 6–20)
Bilirubin Total: 0.5 mg/dL (ref 0.0–1.2)
CO2: 22 mmol/L (ref 20–29)
Calcium: 9.6 mg/dL (ref 8.7–10.2)
Chloride: 97 mmol/L (ref 96–106)
Creatinine, Ser: 0.68 mg/dL (ref 0.57–1.00)
Globulin, Total: 2.8 g/dL (ref 1.5–4.5)
Glucose: 125 mg/dL — ABNORMAL HIGH (ref 70–99)
Potassium: 3.7 mmol/L (ref 3.5–5.2)
Sodium: 135 mmol/L (ref 134–144)
Total Protein: 7.3 g/dL (ref 6.0–8.5)
eGFR: 114 mL/min/{1.73_m2} (ref >59)

## 2021-04-06 LAB — CBC/D/PLT+RPR+RH+ABO+RUBIGG...
Antibody Screen: NEGATIVE
Basophils Absolute: 0 10*3/uL (ref 0.0–0.2)
Basos: 0 %
EOS (ABSOLUTE): 0.1 10*3/uL (ref 0.0–0.4)
Eos: 1 %
HCV Ab: NONREACTIVE
HIV Screen 4th Generation wRfx: NONREACTIVE
Hematocrit: 39.9 % (ref 34.0–46.6)
Hemoglobin: 14.9 g/dL (ref 11.1–15.9)
Hepatitis B Surface Ag: NEGATIVE
Lymphocytes Absolute: 1.2 10*3/uL (ref 0.7–3.1)
Lymphs: 11 %
MCH: 30.7 pg (ref 26.6–33.0)
MCHC: 37.3 g/dL — ABNORMAL HIGH (ref 31.5–35.7)
MCV: 82 fL (ref 79–97)
Monocytes Absolute: 0.6 10*3/uL (ref 0.1–0.9)
Monocytes: 5 %
Neutrophils Absolute: 9.1 10*3/uL — ABNORMAL HIGH (ref 1.4–7.0)
Neutrophils: 83 %
Platelets: 248 10*3/uL (ref 150–450)
RBC: 4.86 x10E6/uL (ref 3.77–5.28)
RDW: 12.2 % (ref 11.7–15.4)
RPR Ser Ql: NONREACTIVE
Rh Factor: POSITIVE
Rubella Antibodies, IGG: 12 index (ref >0.99)
WBC: 11 10*3/uL — ABNORMAL HIGH (ref 3.4–10.8)

## 2021-04-06 LAB — TSH: TSH: 1.18 u[IU]/mL (ref 0.450–4.500)

## 2021-04-06 LAB — PROTEIN / CREATININE RATIO, URINE
Creatinine, Urine: 41.6 mg/dL
Protein, Ur: 5.4 mg/dL
Protein/Creat Ratio: 130 mg/g creat (ref 0–200)

## 2021-04-06 LAB — HEMOGLOBIN A1C
Est. average glucose Bld gHb Est-mCnc: 117 mg/dL
Hgb A1c MFr Bld: 5.7 % — ABNORMAL HIGH (ref 4.8–5.6)

## 2021-04-06 LAB — CULTURE, OB URINE

## 2021-04-06 LAB — URINE CULTURE, OB REFLEX: Organism ID, Bacteria: NO GROWTH

## 2021-04-06 LAB — HCV INTERPRETATION

## 2021-04-06 NOTE — Telephone Encounter (Addendum)
-----   Message from Donnamae Jude, MD sent at 04/06/2021  7:24 AM EST ----- ?Please schedule for early 2 hour ? ?3/10  1233  Called pt w/[facility] interpreter Randall Hiss [phone]. She was informed of abnormal test result and recommendation for 2hr GTT to check for gestational diabetes. Instructions given and appt scheduled on 3/13. Pt voiced understanding and agreed to plan of care.  ?

## 2021-04-06 NOTE — Telephone Encounter (Signed)
I called Brittany Knapp with Interpreter [facility] to offer CenteringPregnancy prenatal care. She states she has done before and would rather having individual prenatal visits.  ?Staci Acosta ?

## 2021-04-09 ENCOUNTER — Other Ambulatory Visit: Payer: Self-pay

## 2021-04-09 DIAGNOSIS — R7303 Prediabetes: Secondary | ICD-10-CM

## 2021-04-09 LAB — GC/CHLAMYDIA PROBE AMP (~~LOC~~) NOT AT ARMC
Chlamydia: NEGATIVE
Comment: NEGATIVE
Comment: NORMAL
Neisseria Gonorrhea: NEGATIVE

## 2021-04-10 LAB — GLUCOSE TOLERANCE, 2 HOURS W/ 1HR
Glucose, 1 hour: 143 mg/dL (ref 70–179)
Glucose, 2 hour: 104 mg/dL (ref 70–152)
Glucose, Fasting: 77 mg/dL (ref 70–91)

## 2021-04-24 ENCOUNTER — Other Ambulatory Visit: Payer: Self-pay

## 2021-04-24 ENCOUNTER — Encounter: Payer: Self-pay | Admitting: Family Medicine

## 2021-04-24 ENCOUNTER — Other Ambulatory Visit (HOSPITAL_COMMUNITY)
Admission: RE | Admit: 2021-04-24 | Discharge: 2021-04-24 | Disposition: A | Discharge status: Home / Self Care | Payer: Self-pay | Source: Ambulatory Visit | Attending: Family Medicine | Admitting: Family Medicine

## 2021-04-24 ENCOUNTER — Ambulatory Visit (INDEPENDENT_AMBULATORY_CARE_PROVIDER_SITE_OTHER): Payer: Self-pay | Admitting: Family Medicine

## 2021-04-24 VITALS — BP 131/79 | HR 92 | Wt 147.9 lb

## 2021-04-24 DIAGNOSIS — O34219 Maternal care for unspecified type scar from previous cesarean delivery: Secondary | ICD-10-CM | POA: Insufficient documentation

## 2021-04-24 DIAGNOSIS — Z124 Encounter for screening for malignant neoplasm of cervix: Secondary | ICD-10-CM

## 2021-04-24 DIAGNOSIS — K219 Gastro-esophageal reflux disease without esophagitis: Secondary | ICD-10-CM

## 2021-04-24 DIAGNOSIS — O0990 Supervision of high risk pregnancy, unspecified, unspecified trimester: Secondary | ICD-10-CM

## 2021-04-24 DIAGNOSIS — O09521 Supervision of elderly multigravida, first trimester: Secondary | ICD-10-CM

## 2021-04-24 MED ORDER — PANTOPRAZOLE SODIUM 40 MG PO TBEC: 40.0000 mg | DELAYED_RELEASE_TABLET | Freq: Every day | ORAL | 1 refills | Status: DC

## 2021-04-24 MED ORDER — ASPIRIN EC 81 MG PO TBEC: 81.0000 mg | DELAYED_RELEASE_TABLET | Freq: Every day | ORAL | 3 refills | Status: DC

## 2021-04-24 NOTE — Patient Instructions (Signed)
Segundo trimestre de [place] ?Second Trimester of Pregnancy ?El segundo trimestre de Media planner va desde la semana 13 hasta la semana 27. Es Software engineer desde el mes 5 hasta el mes 7 de [place]. El segundo trimestre suele ser el momento en el que mejor se siente. Su organismo se ha adaptado a Public relations account executive, y comienza a Engineering geologist. ?Durante el segundo trimestre: ?Las n?useas del embarazo han disminuido o han desaparecido completamente. ?Usted puede tener m?s energ?a. ?Es posible que tenga un aumento del apetito. ?El segundo trimestre es tambi?n un per?odo en el que el beb? en gestaci?n (feto) crece r?pidamente. Hacia el final del sexto mes, el feto puede medir aproximadamente 12 pulgadas y pesar alrededor de 1? libras. Es probable que sienta que el beb? se Software engineer (da pataditas) entre las 16 y 20?semanas del Media planner. ?Cambios en el cuerpo durante el segundo trimestre ?Su cuerpo continua experimentando numerosos cambios durante su segundo trimestre. Los cambios var?an y generalmente vuelven a la normalidad despu?s del nacimiento del beb?Marland Kitchen ?Cambios f?sicos ?Seguir? aumentando de [place]. Notar? que la parte baja del abdomen sobresale. ?Podr?n aparecer las primeras estr?as en las caderas, el abdomen y [place]. ?Las mamas seguir?n creciendo y se tornar?n sensibles. ?Pueden aparecer zonas oscuras o manchas (cloasma o m?scara del embarazo) en el rostro. ?Es posible que se forme una l?nea oscura desde el ombligo hasta la zona del pubis (linea nigra). ?Tal vez haya cambios en el cabello. Esto cambios pueden incluir su engrosamiento, crecimiento r?pido y [employer] textura. A algunas personas tambi?n se les cae el cabello durante o despu?s del [place], o tienen el cabello seco o fino. ?Cambios en la salud ?Comienza a tener dolores de [place]. ?Es posible que tenga acidez estomacal. ?Puede tener estre?imiento. ?Pueden aparecer hemorroides o abultarse e hincharse las venas (venas varicosas). ?Las enc?as Air traffic controller y estar sensibles al cepillado y al hilo dental. ?Tal vez tenga necesidad de orinar con m?s frecuencia porque el feto est? ejerciendo presi?n sobre la vejiga. ?Puede sentir dolor en la espalda. Esto se debe a: ?Aumento de peso. ?Las hormonas del [place] articulaciones en la pelvis. ?Un cambio en el peso y los m?sculos que ayudan a mantener su equilibrio. ?Siga estas instrucciones en su casa: ?Medicamentos ?Siga las instrucciones del m?dico en relaci?n con el uso de medicamentos. Durante el embarazo, hay medicamentos que pueden tomarse y 70 que no. No tome medicamentos a menos que lo haya autorizado el m?dico. ?Tome vitaminas prenatales que contengan por lo menos 600?microgramos (mcg) de ?cido f?lico. ?Comida y bebida ?Lleve una dieta saludable que incluya frutas y verduras frescas, cereales integrales, buenas fuentes de prote?nas como carnes magras, huevos o tofu, y productos l?cteos descremados. ?Evite la carne cruda y [place], la [place] y el queso sin Radio producer. Estos portan g?rmenes que Dentist?o tanto a usted como al beb?Marland Kitchen ?Es posible que tenga que tomar estas medidas para prevenir o tratar el estre?imiento: ?Electronics engineer suficiente l?quido como para Theatre manager la orina de color amarillo p?lido. ?Consumir alimentos ricos en fibra, como frijoles, cereales integrales, y frutas y verduras frescas. ?Limitar el consumo de alimentos ricos en grasa y az?cares procesados, como los alimentos fritos o dulces. ?Actividad ?Haga ejercicio solamente como se lo haya indicado el m?dico. La mayor?a de las personas pueden continuar su rutina de ejercicios durante el [place]. Intente realizar como m?nimo 30?minutos de actividad f?sica por lo menos 5?d?as a Best boy. Deje de hacer ejercicio si comienza a Cytogeneticist ?  tero. ?Deje de hacer ejercicio si le aparecen dolor o c?licos en la parte baja del vientre o de la espalda. ?Evite hacer ejercicio si hace mucho calor o humedad, o si se  encuentra a una altitud elevada. ?Evite levantar pesos [employer]. ?Si lo desea, puede seguir teniendo [employer], salvo que el m?dico le indique lo contrario. ?Alivio del dolor y del [place] ?Use un sujetador que le brinde buen soporte para prevenir las molestias causadas por la sensibilidad en las [place]. ?Dese ba?os de asiento con agua tibia para Best boy o las molestias causadas por las hemorroides. Use una crema para las hemorroides si el m?dico la autoriza. ?Descanse con las piernas levantadas (elevadas) si tiene calambres en las piernas o dolor en la parte baja de la espalda. ?Si tiene venas varicosas: ?Use medias de compresi?n como se lo haya indicado el m?dico. ?Eleve los pies durante 15?minutos, 3 o 4?veces por d?a. ?Limite el consumo de sal en su dieta. ?Seguridad ?Use el cintur?n de seguridad en todo momento mientras conduce o va en auto. ?Hable con el m?dico si es v?ctima de [employer] o f?sico. ?Aptos ?No se d? ba?os de inmersi?n en agua caliente, ba?os turcos ni saunas. ?No se haga duchas vaginales. No use tampones ni toallas higi?nicas perfumadas. ?Evite el contacto con las bandejas sanitarias de los gatos y la tierra que estos animales usan. Estos elementos contienen bacterias que pueden causar defectos cong?nitos al beb? y la posible p?rdida del feto debido a un aborto espont?neo o muerte fetal. ?No use remedios a base de hierbas, alcohol, drogas ilegales ni medicamentos que no est?n aprobados por el m?dico. Las sustancias qu?micas de estos productos pueden da?ar al beb?Marland Kitchen ?No consuma ning?n producto que contenga nicotina o tabaco, como cigarrillos, cigarrillos electr?nicos y tabaco de Higher education careers adviser. Si necesita ayuda para dejar de fumar, consulte al m?dico. ?Instrucciones generales ?Durante una visita prenatal de rutina, el m?dico le har? un examen f?sico y Equities trader. Tambi?n le hablar? sobre su salud general. Cumpla con todas las visitas de seguimiento. Esto es  importante. ?P?dale al m?dico que la derive a clases de educaci?n prenatal en su localidad. ?Pida ayuda si tiene necesidades nutricionales o de asesoramiento Solicitor. El m?dico puede aconsejarla o derivarla a especialistas para que la ayuden con diferentes necesidades. ?D?nde buscar m?s informaci?n ?American Pregnancy Association (Asociaci?n Americana del Embarazo): americanpregnancy.org ?[employer] of Obstetricians and Gynecologists (Colegio Estadounidense de Obstetras y Ginec?logos): SwimmingTub.com.br? ?Office on Home [employer]ot ([place]): [email or web address].com.br ?Comun?quese con un m?dico si tiene: ?Un dolor de cabeza que no desaparece despu?s de tomar analg?sicos. ?Cambios en la visi?n o ve manchas delante de los ojos. ?C?licos leves, presi?n en la pelvis o dolor persistente en el abdomen. ?N?useas persistentes, v?mitos o diarrea. ?Secreci?n vaginal con mal olor u orina con olor f?tido. ?Dolor al [employer]. ?Hinchaz?n s?bita o extrema del rostro, las manos, los tobillos, los pies o las piernas. ?Fiebre. ?Busque ayuda de inmediato si: ?Tiene una p?rdida de l?quido por la vagina. ?Tiene sangrado ligero o manchas vaginales. ?Tiene dolor intenso o c?licos en el abdomen. ?Presenta dificultad para respirar. ?Siente dolor en el pecho. ?Tiene episodios de desmayo. ?No ha sentido a su beb? moverse durante el per?odo de [employer] indic? el m?dico. ?Tiene dolor, hinchaz?n o enrojecimiento nuevos o m?s intensos en un brazo o una pierna. ?Resumen ?El segundo trimestre de Media planner va desde la semana 51 hasta la 27 (desde el mes 4 [place]  6). ?No use remedios a base de hierbas, alcohol, drogas ilegales ni medicamentos que no est?n aprobados por el m?dico. Las sustancias qu?micas de estos productos pueden da?ar al beb?Marland Kitchen ?Haga ejercicio solamente como se lo haya indicado el m?dico. La mayor?a de las personas pueden continuar su rutina de ejercicios durante el  [employer]. ?Cumpla con todas las visitas de seguimiento. Esto es importante. ?Esta informaci?n no tiene Marine scientist el consejo del m?dico. Aseg?rese de hacerle al m?dico cualquier pregunta que tenga. ?Document Revise

## 2021-04-24 NOTE — Progress Notes (Signed)
?  ? ?Subjective:  ? ?Brittany Knapp is a 41 y.o. [phone] at 75w5dby LMP being seen today for her first obstetrical visit.  Her obstetrical history is significant for advanced maternal age and previous C-section . Patient does intend to breast feed. Pregnancy history fully reviewed. ? ?Patient reports no complaints. ? ?HISTORY: ?OB History  ?Gravida Para Term Preterm AB Living  ?'4 3 2 1 '$ 0 2  ?SAB IAB Ectopic Multiple Live Births  ?0 0 0 0 2  ?  ?# Outcome Date GA Lbr Len/2nd Weight Sex Delivery Anes PTL Lv  ?4 Current           ?3 Preterm 03/17/17        FD  ?2 Term 01/31/99 381w2d8 lb 8 oz (3.856 kg) M VBAC EPI N LIV  ?1 Term 01/13/98 4084w3d lb 13.1 oz (4 kg) M CS-LTranv Spinal N LIV  ?  ?Past Medical History:  ?Diagnosis Date  ? Medical history non-contributory   ? Pregnancy induced hypertension 1999  ? ?Past Surgical History:  ?Procedure Laterality Date  ? CESAREAN SECTION    ? ?Family History  ?Problem Relation Age of Onset  ? Hypertension Mother   ? Diabetes Father   ? Diabetes Sister   ? Depression Sister   ? Diabetes Brother   ? Depression Brother   ? ?Social History  ? ?Tobacco Use  ? Smoking status: Never  ? Smokeless tobacco: Never  ?Vaping Use  ? Vaping Use: Never used  ?Substance Use Topics  ? Alcohol use: Never  ? Drug use: Never  ? ?No Known Allergies ?Current Outpatient Medications on File Prior to Visit  ?Medication Sig Dispense Refill  ? Prenatal Vit-Fe Fumarate-FA (MULTIVITAMIN-PRENATAL) 27-0.8 MG TABS tablet Take 1 tablet by mouth daily at 12 noon.    ? ?No current facility-administered medications on file prior to visit.  ? ? ? ?Exam  ? ?Vitals:  ? 04/24/21 1555 04/24/21 1616  ?BP: (!) 142/85 131/79  ?Pulse: 92 92  ?Weight: 147 lb 14.4 oz (67.1 kg)   ? ?Fetal Heart Rate (bpm): 152 ? ?Pelvic Exam: Perineum: no hemorrhoids, normal perineum  ? Vulva: normal external genitalia, no lesions  ? Vagina:  normal mucosa, normal discharge  ? Cervix: no lesions and normal, pap smear done.   ? Adnexa:  normal adnexa and no mass, fullness, tenderness  ? Bony Pelvis: average  ?System: General: well-developed, well-nourished female in no acute distress  ? Skin: normal coloration and turgor, no rashes  ? Neurologic: oriented, normal, negative, normal mood  ? Extremities: normal strength, tone, and muscle mass, ROM of all joints is normal  ? HEENT PERRLA, extraocular movement intact and sclera clear, anicteric  ? Mouth/Teeth mucous membranes moist, pharynx normal without lesions and dental hygiene good  ? Neck supple and no masses  ? Cardiovascular: regular rate and rhythm  ? Respiratory:  no respiratory distress, normal breath sounds  ? Abdomen: soft, non-tender; bowel sounds normal; no masses,  no organomegaly  ? ?  ?Assessment:  ? ?Pregnancy: [id number] Active Problem List  ? Diagnosis Date Noted  ? Previous cesarean delivery affecting pregnancy, antepartum 04/24/2021  ? Prediabetes 04/06/2021  ? Supervision of high risk pregnancy, antepartum 04/05/2021  ? AMA (advanced maternal age) multigravida 35+15+irst trimester 04/05/2021  ? ?  ?Plan:  ?1. AMA (advanced maternal age) multigravida 35+73+irst trimester ?Low risk NIPT--has had prediabetes, passed early 2 hour ?- aspirin EC 81 MG tablet;  Take 1 tablet (81 mg total) by mouth daily.  Dispense: 90 tablet; Refill: 3 ? ?2. Screening for cervical cancer ?- Cytology - PAP( [place]) ? ?3. Gastroesophageal reflux disease without esophagitis ?- pantoprazole (PROTONIX) 40 MG tablet; Take 1 tablet (40 mg total) by mouth daily.  Dispense: 30 tablet; Refill: 1 ? ?4. Supervision of high risk pregnancy, antepartum ? ? ?5. Previous cesarean delivery affecting pregnancy, antepartum ?VBAC x 2, wants a TOLAC ? ? ?Initial labs drawn. ?Continue prenatal vitamins. ?Genetic Screening discussed, NIPS: results reviewed. ?Ultrasound discussed; fetal anatomic survey: ordered. ?Problem list reviewed and updated. ?The nature of [place] with  multiple MDs and other Advanced Practice Providers was explained to patient; also emphasized that residents, students are part of our team. ?Routine obstetric precautions reviewed. ?Return in 4 weeks (on 05/22/2021) for [facility]. ? ?  ? ?

## 2021-04-27 LAB — CYTOLOGY - PAP
Comment: NEGATIVE
Diagnosis: UNDETERMINED — AB
High risk HPV: NEGATIVE

## 2021-05-24 ENCOUNTER — Ambulatory Visit (INDEPENDENT_AMBULATORY_CARE_PROVIDER_SITE_OTHER): Payer: Self-pay | Admitting: Family Medicine

## 2021-05-24 VITALS — BP 109/55 | HR 75 | Wt 152.3 lb

## 2021-05-24 DIAGNOSIS — O34219 Maternal care for unspecified type scar from previous cesarean delivery: Secondary | ICD-10-CM

## 2021-05-24 DIAGNOSIS — O09521 Supervision of elderly multigravida, first trimester: Secondary | ICD-10-CM

## 2021-05-24 DIAGNOSIS — R7303 Prediabetes: Secondary | ICD-10-CM

## 2021-05-24 DIAGNOSIS — Z3A18 18 weeks gestation of pregnancy: Secondary | ICD-10-CM

## 2021-05-24 DIAGNOSIS — O0990 Supervision of high risk pregnancy, unspecified, unspecified trimester: Secondary | ICD-10-CM

## 2021-05-24 NOTE — Patient Instructions (Signed)

## 2021-05-24 NOTE — Progress Notes (Signed)
? ?  PRENATAL VISIT NOTE ? ?Subjective:  ?Brittany Knapp is a 41 y.o. [phone] at 90w0dbeing seen today for ongoing prenatal care.  She is currently monitored for the following issues for this low-risk pregnancy and has Supervision of high risk pregnancy, antepartum; AMA (advanced maternal age) multigravida 35+, first trimester; Prediabetes; and Previous cesarean delivery affecting pregnancy, antepartum on their problem list. ? ?Patient reports no complaints.  Contractions: Not present. Vag. Bleeding: None.  Movement: Present. Denies leaking of fluid.  ? ?The following portions of the patient's history were reviewed and updated as appropriate: allergies, current medications, past family history, past medical history, past social history, past surgical history and problem list.  ? ?Objective:  ? ?Vitals:  ? 05/24/21 1045  ?BP: (!) 109/55  ?Pulse: 75  ?Weight: 152 lb 4.8 oz (69.1 kg)  ? ? ?Fetal Status: Fetal Heart Rate (bpm): 154   Movement: Present    ? ?General:  Alert, oriented and cooperative. Patient is in no acute distress.  ?Skin: Skin is warm and dry. No rash noted.   ?Cardiovascular: Normal heart rate noted  ?Respiratory: Normal respiratory effort, no problems with respiration noted  ?Abdomen: Soft, gravid, appropriate for gestational age.  Pain/Pressure: Present     ?Pelvic: Cervical exam deferred        ?Extremities: Normal range of motion.  Edema: None  ?Mental Status: Normal mood and affect. Normal behavior. Normal judgment and thought content.  ? ?Assessment and Plan:  ?Pregnancy: [id number] 151w0d1. Supervision of high risk pregnancy, antepartum ?AFP today ?Has anatomy u/s scheduled. ?- AFP, Serum, Open Spina Bifida ? ?2. AMA (advanced maternal age) multigravida 3575+first trimester ?LR NIPT ? ?3. Previous cesarean delivery affecting pregnancy, antepartum ? ? ?4. Prediabetes ?Normal 2 hour--repeat at 28 wks ? ?General obstetric precautions including but not limited to vaginal bleeding, contractions,  leaking of fluid and fetal movement were reviewed in detail with the patient. ?Please refer to After Visit Summary for other counseling recommendations.  ? ?Return in 4 weeks (on 06/21/2021). ? ?Future Appointments  ?Date Time Provider De[place]?06/01/2021  2:30 PM W[facility]-MFC NURSE [facility]-MFC W[facility]  ?06/01/2021  2:45 PM WMC-MFC US4 WMC-MFCUS WMC  ?06/22/2021  8:15 AM WaGabriel CarinaCNM [facility]? ? ?TaDonnamae JudeMD ? ?

## 2021-05-29 LAB — AFP, SERUM, OPEN SPINA BIFIDA
AFP MoM: 1.1
AFP Value: 48.9 ng/mL
Gest. Age on Collection Date: 18 weeks
Maternal Age At EDD: 40.1 yr
OSBR Risk 1 IN: 8933
Test Results:: NEGATIVE
Weight: 152 [lb_av]

## 2021-06-01 ENCOUNTER — Ambulatory Visit: Payer: Self-pay | Attending: Family Medicine

## 2021-06-01 ENCOUNTER — Ambulatory Visit: Payer: Self-pay | Admitting: *Deleted

## 2021-06-01 VITALS — BP 137/67 | HR 87

## 2021-06-01 DIAGNOSIS — O09521 Supervision of elderly multigravida, first trimester: Secondary | ICD-10-CM

## 2021-06-01 DIAGNOSIS — O0990 Supervision of high risk pregnancy, unspecified, unspecified trimester: Secondary | ICD-10-CM | POA: Insufficient documentation

## 2021-06-04 ENCOUNTER — Other Ambulatory Visit: Payer: Self-pay | Admitting: *Deleted

## 2021-06-04 DIAGNOSIS — O09522 Supervision of elderly multigravida, second trimester: Secondary | ICD-10-CM

## 2021-06-04 DIAGNOSIS — O365990 Maternal care for other known or suspected poor fetal growth, unspecified trimester, not applicable or unspecified: Secondary | ICD-10-CM

## 2021-06-22 ENCOUNTER — Ambulatory Visit: Payer: Self-pay | Admitting: *Deleted

## 2021-06-22 ENCOUNTER — Ambulatory Visit (INDEPENDENT_AMBULATORY_CARE_PROVIDER_SITE_OTHER): Payer: Self-pay | Admitting: Certified Nurse Midwife

## 2021-06-22 ENCOUNTER — Encounter: Payer: Self-pay | Admitting: *Deleted

## 2021-06-22 ENCOUNTER — Ambulatory Visit: Payer: Self-pay | Attending: Maternal & Fetal Medicine

## 2021-06-22 VITALS — BP 120/63 | HR 75 | Wt 158.4 lb

## 2021-06-22 VITALS — BP 118/60 | HR 79

## 2021-06-22 DIAGNOSIS — O0992 Supervision of high risk pregnancy, unspecified, second trimester: Secondary | ICD-10-CM

## 2021-06-22 DIAGNOSIS — O0990 Supervision of high risk pregnancy, unspecified, unspecified trimester: Secondary | ICD-10-CM | POA: Insufficient documentation

## 2021-06-22 DIAGNOSIS — Z3A22 22 weeks gestation of pregnancy: Secondary | ICD-10-CM

## 2021-06-22 DIAGNOSIS — O09522 Supervision of elderly multigravida, second trimester: Secondary | ICD-10-CM | POA: Insufficient documentation

## 2021-06-22 DIAGNOSIS — O365990 Maternal care for other known or suspected poor fetal growth, unspecified trimester, not applicable or unspecified: Secondary | ICD-10-CM | POA: Insufficient documentation

## 2021-06-22 DIAGNOSIS — R7309 Other abnormal glucose: Secondary | ICD-10-CM

## 2021-06-22 DIAGNOSIS — O3510X0 Maternal care for (suspected) chromosomal abnormality in fetus, unspecified, not applicable or unspecified: Secondary | ICD-10-CM

## 2021-06-22 DIAGNOSIS — O09521 Supervision of elderly multigravida, first trimester: Secondary | ICD-10-CM | POA: Insufficient documentation

## 2021-06-22 DIAGNOSIS — Z98891 History of uterine scar from previous surgery: Secondary | ICD-10-CM

## 2021-06-22 DIAGNOSIS — O09292 Supervision of pregnancy with other poor reproductive or obstetric history, second trimester: Secondary | ICD-10-CM

## 2021-06-22 DIAGNOSIS — K219 Gastro-esophageal reflux disease without esophagitis: Secondary | ICD-10-CM

## 2021-06-22 MED ORDER — PANTOPRAZOLE SODIUM 40 MG PO TBEC: 40.0000 mg | DELAYED_RELEASE_TABLET | Freq: Every day | ORAL | 1 refills | Status: DC

## 2021-06-22 NOTE — Progress Notes (Signed)
   PRENATAL VISIT NOTE  Subjective:  Brittany Knapp is a 41 y.o. [phone] at 15w1dbeing seen today for ongoing prenatal care.  She is currently monitored for the following issues for this high-risk pregnancy and has Supervision of high risk pregnancy, antepartum; AMA (advanced maternal age) multigravida 35+, first trimester; Prediabetes; and Previous cesarean delivery affecting pregnancy, antepartum on their problem list.  Patient reports heartburn, no bleeding, no contractions, no cramping, and no leaking.  Contractions: Not present. Vag. Bleeding: None.  Movement: Present. Denies leaking of fluid.   The following portions of the patient's history were reviewed and updated as appropriate: allergies, current medications, past family history, past medical history, past social history, past surgical history and problem list.   Objective:   Vitals:   06/22/21 0820  BP: 120/63  Pulse: 75  Weight: 158 lb 6.4 oz (71.8 kg)    Fetal Status: Fetal Heart Rate (bpm): 151   Movement: Present     General:  Alert, oriented and cooperative. Patient is in no acute distress.  Skin: Skin is warm and dry. No rash noted.   Cardiovascular: Normal heart rate noted  Respiratory: Normal respiratory effort, no problems with respiration noted  Abdomen: Soft, gravid, appropriate for gestational age.  Pain/Pressure: Absent     Pelvic: Cervical exam deferred        Extremities: Normal range of motion.  Edema: None  Mental Status: Normal mood and affect. Normal behavior. Normal judgment and thought content.   Assessment and Plan:  Pregnancy: [id number] 262w1d. Supervision of high risk pregnancy in second trimester - Patient feeling vigorous movement excited to be expecting baby girl.    2. [redacted] weeks gestation of pregnancy - CNM offered option of weekly nurse visits to hear heartbeat of current pregnancy, surrounding time of previous loss. Patient declines at this time.   3. Elevated hemoglobin A1c - Hbg  5.7 - Patient passed her early GTT  - Follow up in 4 weeks for 2hr GTT    4. History of cesarean delivery - Planning a repeat TOLAC.    5.52Multigravida of advanced maternal age in second trimester - Continue to take baby Asprin daily.   6. Gastroesophageal reflux disease without esophagitis - Heartburn improved with Protonix. Continue to take as needed; Prescription refilled.  - pantoprazole (PROTONIX) 40 MG tablet; Take 1 tablet (40 mg total) by mouth daily.  Dispense: 30 tablet; Refill: 1  Preterm labor symptoms and general obstetric precautions including but not limited to vaginal bleeding, contractions, leaking of fluid and fetal movement were reviewed in detail with the patient. Please refer to After Visit Summary for other counseling recommendations.   Return in about 4 weeks (around 07/20/2021) for IN-PERSON, HOB/GTT.  Future Appointments  Date Time Provider DeSouthbridge5/26/2023  3:15 PM [facility]ML[facility]5/26/2023  3:30 PM WMC-MFC US2 [facility]  ShIsaias Sakai Eston MouldCNM

## 2021-06-26 ENCOUNTER — Other Ambulatory Visit: Payer: Self-pay | Admitting: *Deleted

## 2021-06-26 DIAGNOSIS — Z8759 Personal history of other complications of pregnancy, childbirth and the puerperium: Secondary | ICD-10-CM

## 2021-07-19 ENCOUNTER — Other Ambulatory Visit: Payer: Self-pay | Admitting: *Deleted

## 2021-07-19 ENCOUNTER — Ambulatory Visit: Payer: Self-pay | Attending: Obstetrics and Gynecology

## 2021-07-19 ENCOUNTER — Ambulatory Visit: Payer: Self-pay | Admitting: *Deleted

## 2021-07-19 VITALS — BP 119/60 | HR 84

## 2021-07-19 DIAGNOSIS — O09292 Supervision of pregnancy with other poor reproductive or obstetric history, second trimester: Secondary | ICD-10-CM

## 2021-07-19 DIAGNOSIS — O09521 Supervision of elderly multigravida, first trimester: Secondary | ICD-10-CM | POA: Insufficient documentation

## 2021-07-19 DIAGNOSIS — Z8759 Personal history of other complications of pregnancy, childbirth and the puerperium: Secondary | ICD-10-CM | POA: Insufficient documentation

## 2021-07-19 DIAGNOSIS — O09522 Supervision of elderly multigravida, second trimester: Secondary | ICD-10-CM

## 2021-07-19 DIAGNOSIS — O0990 Supervision of high risk pregnancy, unspecified, unspecified trimester: Secondary | ICD-10-CM

## 2021-07-19 DIAGNOSIS — Z3A26 26 weeks gestation of pregnancy: Secondary | ICD-10-CM

## 2021-07-20 ENCOUNTER — Other Ambulatory Visit: Payer: Self-pay

## 2021-07-20 ENCOUNTER — Encounter: Payer: Self-pay | Admitting: Certified Nurse Midwife

## 2021-07-27 ENCOUNTER — Ambulatory Visit (INDEPENDENT_AMBULATORY_CARE_PROVIDER_SITE_OTHER): Payer: Self-pay | Admitting: Obstetrics and Gynecology

## 2021-07-27 ENCOUNTER — Other Ambulatory Visit: Payer: Self-pay

## 2021-07-27 ENCOUNTER — Encounter: Payer: Self-pay | Admitting: Obstetrics and Gynecology

## 2021-07-27 VITALS — BP 112/60 | HR 75 | Wt 165.6 lb

## 2021-07-27 DIAGNOSIS — O0990 Supervision of high risk pregnancy, unspecified, unspecified trimester: Secondary | ICD-10-CM

## 2021-07-27 DIAGNOSIS — Z3A27 27 weeks gestation of pregnancy: Secondary | ICD-10-CM

## 2021-07-27 DIAGNOSIS — Z98891 History of uterine scar from previous surgery: Secondary | ICD-10-CM

## 2021-07-27 DIAGNOSIS — O09521 Supervision of elderly multigravida, first trimester: Secondary | ICD-10-CM

## 2021-07-27 DIAGNOSIS — Z8759 Personal history of other complications of pregnancy, childbirth and the puerperium: Secondary | ICD-10-CM

## 2021-07-27 NOTE — Progress Notes (Unsigned)
    PRENATAL VISIT NOTE  Subjective:  Brittany Knapp is a 41 y.o. [phone] at 62w1dbeing seen today for ongoing prenatal care.  She is currently monitored for the following issues for this high-risk pregnancy and has Supervision of high risk pregnancy, antepartum; AMA (advanced maternal age) multigravida 35+, first trimester; Prediabetes; and Previous cesarean delivery affecting pregnancy, antepartum on their problem list.  Patient reports no complaints.  Contractions: Not present. Vag. Bleeding: None.  Movement: Present. Denies leaking of fluid.   The following portions of the patient's history were reviewed and updated as appropriate: allergies, current medications, past family history, past medical history, past social history, past surgical history and problem list.   Objective:   Vitals:   07/27/21 1058  BP: 112/60  Pulse: 75  Weight: 165 lb 9.6 oz (75.1 kg)    Fetal Status: Fetal Heart Rate (bpm): 151   Movement: Present     General:  Alert, oriented and cooperative. Patient is in no acute distress.  Skin: Skin is warm and dry. No rash noted.   Cardiovascular: Normal heart rate noted  Respiratory: Normal respiratory effort, no problems with respiration noted  Abdomen: Soft, gravid, appropriate for gestational age.  Pain/Pressure: Absent     Pelvic: Cervical exam deferred        Extremities: Normal range of motion.     Mental Status: Normal mood and affect. Normal behavior. Normal judgment and thought content.   Assessment and Plan:  Pregnancy: G4P2102 at 225w1d. [redacted] weeks gestation of pregnancy 28wk labs today.  2. History of VBAC Tolac consent next visit  3. History of IUFD at 31wks 6/22: 34%, 865gm, ac 44%, afi wnl Recommend to start ap testing at 31-32wks  4. AMA (advanced maternal age) multigravida 3564+first trimester  Preterm labor symptoms and general obstetric precautions including but not limited to vaginal bleeding, contractions, leaking of fluid and fetal  movement were reviewed in detail with the patient. Please refer to After Visit Summary for other counseling recommendations.   Return in about 2 weeks (around 08/10/2021) for md or app, high risk ob, in person.  Future Appointments  Date Time Provider DeMack[place]7/12/2021 11:15 AM PiAletha HalimMD [facility]7/27/2023  9:35 AM NeCaren MacadamMD WMSouth[facility]MD[facility]8/04/2021  3:15 PM WMC-MFC NURSE [facility]FC [facility]8/04/2021  3:30 PM WMC-MFC US2 [facility]MFCUS [facility]8/10/2021  8:35 AM PrDonnamae JudeMD WMK[facility] CenterMR[facility]  ChAletha HalimMD

## 2021-07-28 ENCOUNTER — Encounter: Payer: Self-pay | Admitting: Obstetrics and Gynecology

## 2021-07-28 DIAGNOSIS — O24419 Gestational diabetes mellitus in pregnancy, unspecified control: Secondary | ICD-10-CM

## 2021-07-28 DIAGNOSIS — Z8759 Personal history of other complications of pregnancy, childbirth and the puerperium: Secondary | ICD-10-CM | POA: Insufficient documentation

## 2021-07-28 HISTORY — DX: Gestational diabetes mellitus in pregnancy, unspecified control: O24.419

## 2021-07-28 LAB — HIV ANTIBODY (ROUTINE TESTING W REFLEX): HIV Screen 4th Generation wRfx: NONREACTIVE

## 2021-07-28 LAB — CBC
Hematocrit: 36.5 % (ref 34.0–46.6)
Hemoglobin: 12.2 g/dL (ref 11.1–15.9)
MCH: 27.8 pg (ref 26.6–33.0)
MCHC: 33.4 g/dL (ref 31.5–35.7)
MCV: 83 fL (ref 79–97)
Platelets: 194 10*3/uL (ref 150–450)
RBC: 4.39 x10E6/uL (ref 3.77–5.28)
RDW: 12.9 % (ref 11.7–15.4)
WBC: 9 10*3/uL (ref 3.4–10.8)

## 2021-07-28 LAB — RPR: RPR Ser Ql: NONREACTIVE

## 2021-07-28 LAB — GLUCOSE TOLERANCE, 2 HOURS W/ 1HR
Glucose, 1 hour: 218 mg/dL — ABNORMAL HIGH (ref 70–179)
Glucose, 2 hour: 156 mg/dL — ABNORMAL HIGH (ref 70–152)
Glucose, Fasting: 94 mg/dL — ABNORMAL HIGH (ref 70–91)

## 2021-07-30 ENCOUNTER — Telehealth: Payer: Self-pay | Admitting: *Deleted

## 2021-07-30 DIAGNOSIS — O24419 Gestational diabetes mellitus in pregnancy, unspecified control: Secondary | ICD-10-CM

## 2021-07-30 NOTE — Telephone Encounter (Signed)
Called pt and informed her of 2hr GTT results indicating Gestational Diabetes. Appt scheduled for Diabetes Education on 7/17 @ 0815. Pt does not have insurance and was informed that she will receive her testing supplies from our office. She voiced understanding.

## 2021-08-08 ENCOUNTER — Other Ambulatory Visit: Payer: Self-pay

## 2021-08-08 ENCOUNTER — Encounter: Payer: Self-pay | Admitting: Obstetrics and Gynecology

## 2021-08-08 ENCOUNTER — Ambulatory Visit (INDEPENDENT_AMBULATORY_CARE_PROVIDER_SITE_OTHER): Payer: Self-pay | Admitting: Obstetrics and Gynecology

## 2021-08-08 VITALS — BP 120/70 | HR 87 | Wt 166.7 lb

## 2021-08-08 DIAGNOSIS — O09521 Supervision of elderly multigravida, first trimester: Secondary | ICD-10-CM

## 2021-08-08 DIAGNOSIS — Z98891 History of uterine scar from previous surgery: Secondary | ICD-10-CM | POA: Insufficient documentation

## 2021-08-08 DIAGNOSIS — Z3A28 28 weeks gestation of pregnancy: Secondary | ICD-10-CM

## 2021-08-08 DIAGNOSIS — O24419 Gestational diabetes mellitus in pregnancy, unspecified control: Secondary | ICD-10-CM

## 2021-08-08 DIAGNOSIS — Z8759 Personal history of other complications of pregnancy, childbirth and the puerperium: Secondary | ICD-10-CM

## 2021-08-08 NOTE — Progress Notes (Signed)
    PRENATAL VISIT NOTE  Subjective:  Brittany Knapp is a 41 y.o. [phone] at 27w6dbeing seen today for ongoing prenatal care.  She is currently monitored for the following issues for this high-risk pregnancy and has Supervision of high risk pregnancy, antepartum; AMA (advanced maternal age) multigravida 35+, first trimester; Prediabetes; Previous cesarean delivery affecting pregnancy, antepartum; History of IUFD; GDM (gestational diabetes mellitus); and History of VBAC on their problem list.  Patient reports no complaints.  Contractions: Not present. Vag. Bleeding: None.  Movement: Present. Denies leaking of fluid.   The following portions of the patient's history were reviewed and updated as appropriate: allergies, current medications, past family history, past medical history, past social history, past surgical history and problem list.   Objective:   Vitals:   08/08/21 1105  BP: 120/70  Pulse: 87  Weight: 166 lb 11.2 oz (75.6 kg)    Fetal Status: Fetal Heart Rate (bpm): 150   Movement: Present     General:  Alert, oriented and cooperative. Patient is in no acute distress.  Skin: Skin is warm and dry. No rash noted.   Cardiovascular: Normal heart rate noted  Respiratory: Normal respiratory effort, no problems with respiration noted  Abdomen: Soft, gravid, appropriate for gestational age.  Pain/Pressure: Absent     Pelvic: Cervical exam deferred        Extremities: Normal range of motion.     Mental Status: Normal mood and affect. Normal behavior. Normal judgment and thought content.   Assessment and Plan:  Pregnancy: [id number] 247w6d. History of VBAC Desires another tolac after r/b d/w her today. Tolac form signed today  2. [redacted] weeks gestation of pregnancy  3. AMA (advanced maternal age) multigravida 3542+first trimester  4. Gestational diabetes mellitus (GDM), antepartum, gestational diabetes method of control unspecified Has dm education app on 7/17  5. History of  IUFD Already getting serial u/s and recommend starting ap testing around time of iufd 31-32wks 07/19/21: 34%, 865g, ac 44%, afi wnl  Preterm labor symptoms and general obstetric precautions including but not limited to vaginal bleeding, contractions, leaking of fluid and fetal movement were reviewed in detail with the patient. Please refer to After Visit Summary for other counseling recommendations.   No follow-ups on file.  Future Appointments  Date Time Provider DeChignik7/17/2023  8:15 AM WMSurg[facility] MarylandMB[facility]CM[facility]7/27/2023  9:35 AM NeCaren MacadamMD WMM[facility] CenterM[facility]8/04/2021  3:15 PM WMC-MFC NURSE WMC-MFC WMFirst [facility]8/04/2021  3:30 PM WMC-MFC US2 WMC-MFCUS WMBrent[facility] LLC8/10/2021  8:35 AM PrDonnamae JudeMD WMP[facility] LLCMR[facility]  ChAletha HalimMD

## 2021-08-13 ENCOUNTER — Other Ambulatory Visit: Payer: Self-pay

## 2021-08-13 ENCOUNTER — Encounter: Payer: Self-pay | Attending: Obstetrics and Gynecology | Admitting: Registered"

## 2021-08-13 ENCOUNTER — Ambulatory Visit (INDEPENDENT_AMBULATORY_CARE_PROVIDER_SITE_OTHER): Payer: Self-pay | Admitting: Registered"

## 2021-08-13 DIAGNOSIS — O24419 Gestational diabetes mellitus in pregnancy, unspecified control: Secondary | ICD-10-CM

## 2021-08-13 DIAGNOSIS — M549 Dorsalgia, unspecified: Secondary | ICD-10-CM

## 2021-08-13 DIAGNOSIS — O99891 Other specified diseases and conditions complicating pregnancy: Secondary | ICD-10-CM

## 2021-08-13 DIAGNOSIS — Z713 Dietary counseling and surveillance: Secondary | ICD-10-CM | POA: Insufficient documentation

## 2021-08-13 NOTE — Progress Notes (Signed)
Patient was seen for Gestational Diabetes self-management on 08/13/2021  Start time 0818 and End time 0915   Estimated due date: 10/25/21; [redacted]w[redacted]d Clinical: Medications: reviewed Medical History: reviewed Labs: OGTT 94, 218, 156; A1c 5.7% (early OGTT 77, 143, 104)  Dietary and Lifestyle History: Pt states is eating less rice and more beans since having diagnosis. Pt reports she was drinking coke/juice occasionally prior to dx.  Physical Activity: ADL, cleans houses Stress: no stress, feels good Sleep: good except wakes of about every 2 hrs due to back pain, and frequent urination.  24 hr Recall:  First Meal: tea, multi grain bagel Snack: shake: oatmeal, fruit, water Second meal: beans, chicken, vegetables, 2-3 tacos Snack: jello or zero sugar yogurt Third meal: chicken, rice, 2 tortillas Snack: fruit Beverages: water, tea  NUTRITION INTERVENTION  Nutrition education (E-1) on the following topics:   Initial Follow-up  '[x]'$  '[]'$  Definition of Gestational Diabetes '[x]'$  '[]'$  Why dietary management is important in controlling blood glucose '[x]'$  '[]'$  Effects each nutrient has on blood glucose levels '[x]'$  '[]'$  Simple carbohydrates vs complex carbohydrates '[x]'$  '[]'$  Fluid intake '[x]'$  '[]'$  Creating a balanced meal plan '[x]'$  '[]'$  Carbohydrate counting  '[x]'$  '[]'$  When to check blood glucose levels '[x]'$  '[]'$  Proper blood glucose monitoring techniques '[x]'$  '[]'$  Effect of stress and stress reduction techniques  '[x]'$  '[]'$  Exercise effect on blood glucose levels, appropriate exercise during pregnancy '[x]'$  '[]'$  Importance of limiting caffeine and abstaining from alcohol and smoking '[x]'$  '[]'$  Medications used for blood sugar control during pregnancy '[x]'$  '[]'$  Hypoglycemia and rule of 15 '[x]'$  '[]'$  Postpartum self care  Blood glucose monitor given: [employer] Lot # [id number]: 11-28-22 CBG: 97 mg/dL  Patient instructed to monitor glucose levels: FBS: 60 - ? 95 mg/dL (some clinics use 90 for cutoff) 1 hour: ? 140 mg/dL 2 hour: ?  120 mg/dL  Patient received handouts: Nutrition Diabetes and Pregnancy Carbohydrate Counting List  Patient will be seen for follow-up as needed.

## 2021-08-14 ENCOUNTER — Encounter: Payer: Self-pay | Admitting: Physical Therapy

## 2021-08-14 ENCOUNTER — Encounter: Payer: Self-pay | Attending: Obstetrics and Gynecology | Admitting: Physical Therapy

## 2021-08-14 ENCOUNTER — Other Ambulatory Visit: Payer: Self-pay

## 2021-08-14 DIAGNOSIS — O99891 Other specified diseases and conditions complicating pregnancy: Secondary | ICD-10-CM | POA: Insufficient documentation

## 2021-08-14 DIAGNOSIS — M62830 Muscle spasm of back: Secondary | ICD-10-CM

## 2021-08-14 DIAGNOSIS — M549 Dorsalgia, unspecified: Secondary | ICD-10-CM | POA: Insufficient documentation

## 2021-08-14 NOTE — Therapy (Addendum)
OUTPATIENT PHYSICAL THERAPY FEMALE PELVIC EVALUATION   Patient Name: Brittany Knapp MRN: [id number] DOB:Apr 26, 1980, 41 y.o., female Today's Date: 08/14/2021   PT End of Session - 08/14/21 1637     Visit Number 1    Date for PT Re-Evaluation 10/09/21    Authorization Type Self Pay    PT Start Time 4944    PT Stop Time 1630    PT Time Calculation (min) 35 min    Activity Tolerance Patient tolerated treatment well;No increased pain    Behavior During Therapy [facility] for tasks assessed/performed             Past Medical History:  Diagnosis Date   Medical history non-contributory    Pregnancy induced hypertension 1999   Past Surgical History:  Procedure Laterality Date   CESAREAN SECTION     Patient Active Problem List   Diagnosis Date Noted   History of VBAC 08/08/2021   History of IUFD 07/28/2021   GDM (gestational diabetes mellitus) 07/28/2021   Previous cesarean delivery affecting pregnancy, antepartum 04/24/2021   Prediabetes 04/06/2021   Supervision of high risk pregnancy, antepartum 04/05/2021   AMA (advanced maternal age) multigravida 35+, first trimester 04/05/2021    PCP: Aletha Halim, MD  REFERRING PROVIDER: Aletha Halim, MD  REFERRING DIAG: O99.891,M54.9 (ICD-10-CM) - Back pain affecting pregnancy in third trimester  THERAPY DIAG:  Muscle spasm of back  Back pain affecting pregnancy in third trimester  Rationale for Evaluation and Treatment Rehabilitation  ONSET DATE: 06/2021  SUBJECTIVE:                                                                                                                                                                                           SUBJECTIVE STATEMENT: Back pain came on suddenly.Marland Kitchen       PAIN:  Are you having pain? Yes NPRS scale: 7/10 Pain location:  thoracic and lumbar  Pain type: discomfort Pain description: intermittent   Aggravating factors: working more than 4 hours Relieving factors: lay  down, tylenol  PRECAUTIONS: Other: pregnant  WEIGHT BEARING RESTRICTIONS No  FALLS:  Has patient fallen in last 6 months? No  LIVING ENVIRONMENT: Lives with: lives with their family  OCCUPATION: cleans houses  PLOF: Independent  PATIENT GOALS reduce the pain  PERTINENT HISTORY:  C-section    PREGNANCY Vaginal deliveries 2 Tearing Yes: few stitches C-section deliveries 1 Currently pregnant Yes: 10/25/2021     OBJECTIVE:   DIAGNOSTIC FINDINGS:  none   COGNITION:  Overall cognitive status: Within functional limits for tasks assessed     SENSATION:  Light touch: Appears intact  Proprioception: Appears intact  LUMBAR SPECIAL TESTS:  Stork standing: Negative   GAIT: Comments: no deficits               POSTURE:  pregnant posture   PELVIC ALIGNMENT:  LUMBARAROM/PROM  A/PROM A/PROM  eval  Flexion Decreased by 25%  Right lateral flexion Decreased by 25%  Left lateral flexion Decreased by 25%  Right rotation Decreased by 25%   (Blank rows = not tested)     PALPATION:   General  tenderness located on the thoracic lumbar paraspinals, Decreased mobility of T4-T8                      TODAY'S TREATMENT  EVAL Date:  HEP established-see below    PATIENT EDUCATION:  Education details: Access Code: [email or web address], how to use a tennis ball to massage her back Person educated: Patient Education method: Explanation, Demonstration, Tactile cues, Verbal cues, and Handouts Education comprehension: verbalized understanding, returned demonstration, verbal cues required, tactile cues required, and needs further education   HOME EXERCISE PROGRAM: 08/14/2021 Access Code: [email or web address] URL: [email or web address] Date: 08/14/2021 Prepared by: Earlie Counts  Program Notes place 2 tennis balls in a sock and place behind your back and roll up and down  Exercises - Seated Thoracic Flexion and Rotation with Arms Crossed  - 1 x daily - 7 x weekly - 1 sets - 4  reps - Seated Thoracic Lumbar Extension with Pectoralis Stretch  - 1 x daily - 7 x weekly - 3 sets - 10 reps - Prone Chest Stretch on Chair  - 1 x daily - 7 x weekly - 3 sets - 10 reps  ASSESSMENT:  CLINICAL IMPRESSION: Patient is a 41 y.o. female who was seen today for physical therapy evaluation and treatment for back pain. Patient reports her back pain started 1.5 months ago suddenly. Her back pain is intermittent at level 7/10 and worse when she works more than 4 hours. She will lay down to reduce her pain. She has decreased movement in the thoracic lumbar area. T4-T8 has limited mobility. Patient has tenderness located in the thoracic lumbar paraspinals. She is pregnant and due 10/25/2021. Patient cleans 2-3 hours per day. Patient will benefit from skilled therapy to improve spinal mobility and reduce pain so she can work throughout her pregnancy with minimal discomfort. Patient would benefit from attending our Have less pain, gain info, and be healthy during your pregnancy class in August.    OBJECTIVE IMPAIRMENTS decreased activity tolerance, decreased mobility, increased fascial restrictions, increased muscle spasms, and pain.   ACTIVITY LIMITATIONS lifting, bending, and standing  PARTICIPATION LIMITATIONS: meal prep, cleaning, and occupation  PERSONAL FACTORS  pregnant in her third trimester  are also affecting patient's functional outcome.   REHAB POTENTIAL: Excellent  CLINICAL DECISION MAKING: Stable/uncomplicated  EVALUATION COMPLEXITY: Low  By signing I understand that I am ordering/authorizing the patient to be a participant of the Have Less Pain, Gain Info, and Be Healthy During Your Pregnancy class as a component of this plan of care.   GOALS: Goals reviewed with patient? Yes  SHORT TERM GOALS: Target date: 09/11/2021  Patient is independent with her spinal stretches Baseline: Goal status: INITIAL   LONG TERM GOALS: Target date: 10/09/2021   Patient understands  correct body mechanics to clean houses to put less strain on her back.  Baseline:  Goal status: INITIAL  2.  Patient reports her pain level after cleaning for 4 hours decreased </= 2/10.  Baseline:  Goal status: INITIAL  3.  Patient understands how to manage her back pain as her pregnancy progresses using a tennis ball, stretches, and positions to reduce pain.  Baseline:  Goal status: INITIAL   PLAN: PT FREQUENCY: 1x/week  PT DURATION: 8 weeks  PLANNED INTERVENTIONS: Therapeutic exercises, Therapeutic activity, Neuromuscular re-education, Patient/Family education, Self Care, Joint mobilization, Spinal mobilization, Cryotherapy, Moist heat, Taping, and Manual therapy  PLAN FOR NEXT SESSION: mobilize the spine, manual work to muscles, laying on side for open book, see if she signed up for class, body mechanics   Earlie Counts, PT 08/14/21 4:48 PM PHYSICAL THERAPY DISCHARGE SUMMARY  Visits from Start of Care: 1  Current functional level related to goals / functional outcomes: See above. Patient called to cancel her remaining appointments due to the exercises are working and she is self pay.    Remaining deficits: See above.    Education / Equipment: HEP   Patient agrees to discharge. Patient goals were not met. Patient is being discharged due to being pleased with the current functional level. Thank you for the referral. Earlie Counts, PT 09/04/21 8:30 AM

## 2021-08-23 ENCOUNTER — Encounter: Payer: Self-pay | Admitting: Family Medicine

## 2021-08-23 ENCOUNTER — Ambulatory Visit (INDEPENDENT_AMBULATORY_CARE_PROVIDER_SITE_OTHER): Payer: Self-pay | Admitting: Family Medicine

## 2021-08-23 ENCOUNTER — Other Ambulatory Visit: Payer: Self-pay

## 2021-08-23 VITALS — BP 108/72 | HR 87 | Wt 168.9 lb

## 2021-08-23 DIAGNOSIS — O09521 Supervision of elderly multigravida, first trimester: Secondary | ICD-10-CM

## 2021-08-23 DIAGNOSIS — Z8759 Personal history of other complications of pregnancy, childbirth and the puerperium: Secondary | ICD-10-CM

## 2021-08-23 DIAGNOSIS — Z98891 History of uterine scar from previous surgery: Secondary | ICD-10-CM

## 2021-08-23 DIAGNOSIS — O34219 Maternal care for unspecified type scar from previous cesarean delivery: Secondary | ICD-10-CM

## 2021-08-23 DIAGNOSIS — O24419 Gestational diabetes mellitus in pregnancy, unspecified control: Secondary | ICD-10-CM

## 2021-08-23 DIAGNOSIS — O0990 Supervision of high risk pregnancy, unspecified, unspecified trimester: Secondary | ICD-10-CM

## 2021-08-23 NOTE — Progress Notes (Signed)
   PRENATAL VISIT NOTE  Subjective:  Brittany Knapp is a 41 y.o. [phone] at 15w0dbeing seen today for ongoing prenatal care.  She is currently monitored for the following issues for this high-risk pregnancy and has Supervision of high risk pregnancy, antepartum; AMA (advanced maternal age) multigravida 35+, first trimester; Prediabetes; Previous cesarean delivery affecting pregnancy, antepartum; History of IUFD; GDM (gestational diabetes mellitus); and History of VBAC on their problem list.  Patient reports no complaints.  Contractions: Not present. Vag. Bleeding: None.  Movement: Present. Denies leaking of fluid.   The following portions of the patient's history were reviewed and updated as appropriate: allergies, current medications, past family history, past medical history, past social history, past surgical history and problem list.   Objective:   Vitals:   08/23/21 1037  BP: 108/72  Pulse: 87  Weight: 168 lb 14.4 oz (76.6 kg)    Fetal Status: Fetal Heart Rate (bpm): 151 Fundal Height: 31 cm Movement: Present     General:  Alert, oriented and cooperative. Patient is in no acute distress.  Skin: Skin is warm and dry. No rash noted.   Cardiovascular: Normal heart rate noted  Respiratory: Normal respiratory effort, no problems with respiration noted  Abdomen: Soft, gravid, appropriate for gestational age.  Pain/Pressure: Absent     Pelvic: Cervical exam deferred        Extremities: Normal range of motion.     Mental Status: Normal mood and affect. Normal behavior. Normal judgment and thought content.   Assessment and Plan:  Pregnancy: [id number] 320w0d. History of VBAC Desires RCS -- voiced this today and reviewed that patient would like BTS. Requested case 08/23/21   2. History of IUFD  3. Gestational diabetes mellitus (GDM) in third trimester, gestational diabetes method of control unspecified Fastings-- 8/10 are above 95, with over 50% of these being 100-110 PP at Bfast and  lunch all WNL, Dinner about 50% are elevated. Patient is walking after dinner and having snack after dinner. Her snacks are yogurt and cereal. Notes higher sugars after eating rice and a quesadilla.   Discussed that starting medication vs improving food choices   Patient opted to better control her diet and return in 1 week for glucose log review Discussed medication might be started at that time if BG are similar and would need antenatal testing as well.   4. Previous cesarean delivery affecting pregnancy, antepartum Now desires RCS Will schedule for 9/21 or after   5. Supervision of high risk pregnancy, antepartum Up to date Went to food market today  6. AMA (advanced maternal age) multigravida 3568+first trimester LR NIP  Preterm labor symptoms and general obstetric precautions including but not limited to vaginal bleeding, contractions, leaking of fluid and fetal movement were reviewed in detail with the patient. Please refer to After Visit Summary for other counseling recommendations.   Return in about 1 week (around 08/30/2021) for Routine prenatal care.  Future Appointments  Date Time Provider DeRockleigh8/04/2021  3:15 PM [facility]MT[facility]8/04/2021  3:30 PM WMC-MFC US2 WMC-MFCUS [facility]8/08/2021  8:15 AM WMC-EDUCATION WMC-CWH [facility]8/08/2021  3:00 PM GrMonico HoarPT [facility]8/10/2021  8:35 AM PrDonnamae JudeMD WMZ[facility]8/15/2023  2:00 PM GrMonico HoarPT WMC-OPR [facility]8/22/2023  2:00 PM GrMonico HoarPT WMC-OPR [facility]  KiCaren MacadamMD

## 2021-08-24 ENCOUNTER — Telehealth: Payer: Self-pay | Admitting: Family Medicine

## 2021-08-24 ENCOUNTER — Other Ambulatory Visit: Payer: Self-pay | Admitting: Certified Nurse Midwife

## 2021-08-24 DIAGNOSIS — K219 Gastro-esophageal reflux disease without esophagitis: Secondary | ICD-10-CM

## 2021-08-24 NOTE — Telephone Encounter (Signed)
Called patient to discuss her [place] payment, she confirmed she will be paying on 08/30/21.

## 2021-08-30 ENCOUNTER — Ambulatory Visit (INDEPENDENT_AMBULATORY_CARE_PROVIDER_SITE_OTHER): Payer: Self-pay | Admitting: Obstetrics & Gynecology

## 2021-08-30 ENCOUNTER — Other Ambulatory Visit: Payer: Self-pay

## 2021-08-30 ENCOUNTER — Encounter: Payer: Self-pay | Admitting: Obstetrics & Gynecology

## 2021-08-30 VITALS — BP 117/73 | HR 91 | Wt 166.8 lb

## 2021-08-30 DIAGNOSIS — Z3A32 32 weeks gestation of pregnancy: Secondary | ICD-10-CM

## 2021-08-30 DIAGNOSIS — O34219 Maternal care for unspecified type scar from previous cesarean delivery: Secondary | ICD-10-CM

## 2021-08-30 DIAGNOSIS — O0990 Supervision of high risk pregnancy, unspecified, unspecified trimester: Secondary | ICD-10-CM

## 2021-08-30 DIAGNOSIS — O24419 Gestational diabetes mellitus in pregnancy, unspecified control: Secondary | ICD-10-CM

## 2021-08-30 DIAGNOSIS — Z8759 Personal history of other complications of pregnancy, childbirth and the puerperium: Secondary | ICD-10-CM

## 2021-08-30 MED ORDER — METFORMIN HCL 500 MG PO TABS: 500.0000 mg | ORAL_TABLET | Freq: Every day | ORAL | 5 refills | Status: DC

## 2021-08-30 NOTE — Progress Notes (Signed)
   PRENATAL VISIT NOTE  Subjective:  Brittany Knapp is a 41 y.o. [phone] at 75w0dbeing seen today for ongoing prenatal care.  She is currently monitored for the following issues for this high-risk pregnancy and has Supervision of high risk pregnancy, antepartum; AMA (advanced maternal age) multigravida 35+, first trimester; Prediabetes; Previous cesarean delivery affecting pregnancy, antepartum; History of IUFD at 324 weeks GDM (gestational diabetes mellitus); and History of VBAC on their problem list.  Patient reports no complaints.  Contractions: Not present. Vag. Bleeding: None.  Movement: Present. Denies leaking of fluid.   The following portions of the patient's history were reviewed and updated as appropriate: allergies, current medications, past family history, past medical history, past social history, past surgical history and problem list.   Objective:   Vitals:   08/30/21 1114  BP: 117/73  Pulse: 91  Weight: 166 lb 12.8 oz (75.7 kg)    Fetal Status: Fetal Heart Rate (bpm): 145   Movement: Present     General:  Alert, oriented and cooperative. Patient is in no acute distress.  Skin: Skin is warm and dry. No rash noted.   Cardiovascular: Normal heart rate noted  Respiratory: Normal respiratory effort, no problems with respiration noted  Abdomen: Soft, gravid, appropriate for gestational age.  Pain/Pressure: Absent     Pelvic: Cervical exam deferred        Extremities: Normal range of motion.  Edema: None  Mental Status: Normal mood and affect. Normal behavior. Normal judgment and thought content.   Assessment and Plan:  Pregnancy: [id number] 322w0d. Gestational diabetes mellitus (GDM) in third trimester, gestational diabetes method of control unspecified Reviewed blood sugars, 5/8 fastings are 96-110. Normal PP. Recommended adding Metformin at night, she agreed. She will also try walking in evenings.  Already scheduled to start antenatal testing for history of IUFD, first  scan is 08/31/21. - metFORMIN (GLUCOPHAGE) 500 MG tablet; Take 1 tablet (500 mg total) by mouth at bedtime.  Dispense: 30 tablet; Refill: 5  2. History of IUFD at 3190 weeksill start weekly testing  this week. FM precautions advised.  - [place] FETAL BPP WO NON STRESS; Future  3. Previous cesarean delivery affecting pregnancy, antepartum Already scheduled for RCS.  4. [redacted] weeks gestation of pregnancy 5. Supervision of high risk pregnancy, antepartum No other concerns.  Preterm labor symptoms and general obstetric precautions including but not limited to vaginal bleeding, contractions, leaking of fluid and fetal movement were reviewed in detail with the patient. Please refer to After Visit Summary for other counseling recommendations.   Return in about 1 week (around 09/06/2021) for NST, [facility]: NST, BPP, OFFICE MD VISIT.  Future Appointments  Date Time Provider DeCrow Wing8/04/2021  3:15 PM [facility]MAur[facility]8/04/2021  3:30 PM WMC-MFC US2 WMC-MFCUS [facility]8/08/2021  8:15 AM WMC-EDUCATION WMC-CWH W[facility]8/08/2021  3:00 PM GrMonico HoarPT WM[facility]8/10/2021  8:35 AM PrDonnamae JudeMD WMMules[facility] CenterMV[facility]8/15/2023  2:00 PM GrMonico HoarPT WMC-OPR WMR[facility]8/22/2023  2:00 PM GrMonico HoarPT WMC-OPR WMAccess [facility]  UgVerita SchneidersMD

## 2021-08-30 NOTE — Patient Instructions (Signed)
Return to office for any scheduled appointments. Call the office or [facility] the [facility] if: You begin to have strong, frequent contractions Your water breaks.  Sometimes it is a big gush of fluid, sometimes it is just a trickle that keeps getting your underwear wet or running down your legs You have vaginal bleeding.  It is normal to have a small amount of spotting if your cervix was checked.  You do not feel your baby moving like normal.  If you do not, get something to eat and drink and lay down and focus on feeling your baby move.   If your baby is still not moving like normal, you should call the office or go to MAU. Any other obstetric concerns.  

## 2021-08-31 ENCOUNTER — Ambulatory Visit: Payer: Self-pay | Attending: Obstetrics and Gynecology

## 2021-08-31 ENCOUNTER — Ambulatory Visit: Payer: Self-pay | Admitting: *Deleted

## 2021-08-31 VITALS — BP 112/67 | HR 82

## 2021-08-31 DIAGNOSIS — O0990 Supervision of high risk pregnancy, unspecified, unspecified trimester: Secondary | ICD-10-CM | POA: Insufficient documentation

## 2021-08-31 DIAGNOSIS — O403XX0 Polyhydramnios, third trimester, not applicable or unspecified: Secondary | ICD-10-CM

## 2021-08-31 DIAGNOSIS — Z8759 Personal history of other complications of pregnancy, childbirth and the puerperium: Secondary | ICD-10-CM | POA: Insufficient documentation

## 2021-08-31 DIAGNOSIS — O09523 Supervision of elderly multigravida, third trimester: Secondary | ICD-10-CM

## 2021-08-31 DIAGNOSIS — O09293 Supervision of pregnancy with other poor reproductive or obstetric history, third trimester: Secondary | ICD-10-CM

## 2021-08-31 DIAGNOSIS — O24419 Gestational diabetes mellitus in pregnancy, unspecified control: Secondary | ICD-10-CM

## 2021-08-31 DIAGNOSIS — O09521 Supervision of elderly multigravida, first trimester: Secondary | ICD-10-CM

## 2021-08-31 DIAGNOSIS — Z3A32 32 weeks gestation of pregnancy: Secondary | ICD-10-CM

## 2021-08-31 DIAGNOSIS — O09522 Supervision of elderly multigravida, second trimester: Secondary | ICD-10-CM | POA: Insufficient documentation

## 2021-09-04 ENCOUNTER — Encounter: Payer: Self-pay | Admitting: Physical Therapy

## 2021-09-04 ENCOUNTER — Other Ambulatory Visit: Payer: Self-pay

## 2021-09-04 ENCOUNTER — Encounter: Payer: Self-pay | Attending: Obstetrics and Gynecology | Admitting: Registered"

## 2021-09-04 ENCOUNTER — Ambulatory Visit (INDEPENDENT_AMBULATORY_CARE_PROVIDER_SITE_OTHER): Payer: Self-pay | Admitting: Registered"

## 2021-09-04 DIAGNOSIS — Z3A00 Weeks of gestation of pregnancy not specified: Secondary | ICD-10-CM | POA: Insufficient documentation

## 2021-09-04 DIAGNOSIS — O24419 Gestational diabetes mellitus in pregnancy, unspecified control: Secondary | ICD-10-CM

## 2021-09-04 DIAGNOSIS — Z713 Dietary counseling and surveillance: Secondary | ICD-10-CM | POA: Insufficient documentation

## 2021-09-04 NOTE — Progress Notes (Signed)
Patient was seen for Gestational Diabetes self-management on 09/04/21 Start time 0820 and End time 0841   Estimated due date: 10/25/21; [redacted]w[redacted]d Clinical: Medications: reviewed (did not start metformin) Medical History: reviewed Labs: OGTT 94, 218, 156; A1c 5.7% (early OGTT 77, 143, 104)  Dietary and Lifestyle History: Pt states she did not start metformin and was able to lower her fasting blood sugar by changing her dinner meal and evening snack as well  as walking in the afternoon.  Pt reports physical therapy has helped back pain and not disrupting sleep anymore.  Pt states she misses going to the gym and after the baby is born her mother will be staying with her and she will have time for selfcare.  Physical Activity: housekeeping job, walking in the afternoon Stress: no stress, feels good Sleep: good but still wakes up for frequent urination.  24 hr Recall:  First Meal: tea, protein donuts (herbalife) peanut butter Snack: shake: oatmeal, fruit, water Second meal:12:00 tinga, chicken, onion, lettuce, 2 tortillas 3:30 chicken, broccoli, beans, bell peppers Snack: blueberries Third meal: 7 pm protein shake (oatmeal with water, protein powder, cinnamon) keeps her full Snack: almonds Beverages: water, tea  NUTRITION INTERVENTION  Nutrition education (E-1) on the following topics:   Initial Follow-up  '[x]'$  '[]'$  Definition of Gestational Diabetes '[x]'$  '[]'$  Why dietary management is important in controlling blood glucose '[x]'$  '[]'$  Effects each nutrient has on blood glucose levels '[x]'$  '[]'$  Simple carbohydrates vs complex carbohydrates '[x]'$  '[]'$  Fluid intake '[x]'$  '[]'$  Creating a balanced meal plan '[x]'$  '[]'$  Carbohydrate counting  '[x]'$  '[]'$  When to check blood glucose levels '[x]'$  '[]'$  Proper blood glucose monitoring techniques '[x]'$  '[]'$  Effect of stress and stress reduction techniques  '[x]'$  '[]'$  Exercise effect on blood glucose levels, appropriate exercise during pregnancy '[x]'$  '[]'$  Importance of limiting  caffeine and abstaining from alcohol and smoking '[x]'$  '[]'$  Medications used for blood sugar control during pregnancy '[x]'$  '[]'$  Hypoglycemia and rule of 15 '[x]'$  '[x]'$  Postpartum self care  / diabetes prevention tips  Patient instructed to monitor glucose levels: FBS: 60 - ? 95 mg/dL (some clinics use 90 for cutoff) 1 hour: ? 140 mg/dL 2 hour: ? 120 mg/dL  Patient received handouts: none  Patient will be seen for follow-up as needed.

## 2021-09-05 ENCOUNTER — Ambulatory Visit: Payer: Self-pay

## 2021-09-06 ENCOUNTER — Ambulatory Visit (INDEPENDENT_AMBULATORY_CARE_PROVIDER_SITE_OTHER): Payer: Self-pay | Admitting: Family Medicine

## 2021-09-06 ENCOUNTER — Ambulatory Visit (INDEPENDENT_AMBULATORY_CARE_PROVIDER_SITE_OTHER): Payer: Self-pay

## 2021-09-06 ENCOUNTER — Other Ambulatory Visit: Payer: Self-pay

## 2021-09-06 ENCOUNTER — Ambulatory Visit: Payer: Self-pay | Admitting: General Practice

## 2021-09-06 VITALS — BP 177/69 | HR 88 | Wt 167.0 lb

## 2021-09-06 DIAGNOSIS — O0990 Supervision of high risk pregnancy, unspecified, unspecified trimester: Secondary | ICD-10-CM

## 2021-09-06 DIAGNOSIS — Z8759 Personal history of other complications of pregnancy, childbirth and the puerperium: Secondary | ICD-10-CM

## 2021-09-06 DIAGNOSIS — O24415 Gestational diabetes mellitus in pregnancy, controlled by oral hypoglycemic drugs: Secondary | ICD-10-CM

## 2021-09-06 DIAGNOSIS — O09521 Supervision of elderly multigravida, first trimester: Secondary | ICD-10-CM

## 2021-09-06 DIAGNOSIS — O34219 Maternal care for unspecified type scar from previous cesarean delivery: Secondary | ICD-10-CM

## 2021-09-06 NOTE — Progress Notes (Signed)
   PRENATAL VISIT NOTE  Subjective:  Brittany Knapp is a 41 y.o. [phone] at 17w0dbeing seen today for ongoing prenatal care.  She is currently monitored for the following issues for this high-risk pregnancy and has Supervision of high risk pregnancy, antepartum; AMA (advanced maternal age) multigravida 35+, first trimester; Prediabetes; Previous cesarean delivery affecting pregnancy, antepartum; History of IUFD at 381 weeks GDM (gestational diabetes mellitus); and History of VBAC on their problem list.  Patient reports no complaints.  Contractions: Not present. Vag. Bleeding: None.  Movement: Present. Denies leaking of fluid.   The following portions of the patient's history were reviewed and updated as appropriate: allergies, current medications, past family history, past medical history, past social history, past surgical history and problem list.   Objective:   Vitals:   09/06/21 0840  BP: (!) 177/69  Pulse: 88  Weight: 167 lb (75.8 kg)    Fetal Status: Fetal Heart Rate (bpm): 143 Fundal Height: 34 cm Movement: Present     General:  Alert, oriented and cooperative. Patient is in no acute distress.  Skin: Skin is warm and dry. No rash noted.   Cardiovascular: Normal heart rate noted  Respiratory: Normal respiratory effort, no problems with respiration noted  Abdomen: Soft, gravid, appropriate for gestational age.  Pain/Pressure: Present     Pelvic: Cervical exam deferred        Extremities: Normal range of motion.  Edema: None  Mental Status: Normal mood and affect. Normal behavior. Normal judgment and thought content.   Assessment and Plan:  Pregnancy: [id number] 321w0d. Gestational diabetes mellitus (GDM) in third trimester controlled on oral hypoglycemic drug See photo--fastins are improved. Pp WNL Not taking metformin now if staying below 95. Last growth at 40% on 8/4. With Poly--AFI 27.34   2. Supervision of high risk pregnancy, antepartum   3. AMA (advanced maternal  age) multigravida 3540+first trimester LR NIPT  4. History of IUFD at 3114 weeksn antenatal testing  5. Previous cesarean delivery affecting pregnancy, antepartum Desires RCS and BTL  Preterm labor symptoms and general obstetric precautions including but not limited to vaginal bleeding, contractions, leaking of fluid and fetal movement were reviewed in detail with the patient. Please refer to After Visit Summary for other counseling recommendations.   Return in 2 weeks (on 09/20/2021) [facility] Future Appointments  Date Time Provider DeEast [place]8/10/2021  9:15 AM WMSent[facility] CenterST WM[facility]8/18/2023  9:00 AM WMC-WOCA NST [facility]Qu[facility]8/24/2023  1:30 PM WMC-MFC NURSE WMC-MFC WMA[facility]8/24/2023  1:45 PM WMC-MFC US6 WMC-MFCUS WMN[facility]9/01/2021  3:30 PM WMC-MFC NURSE WMC-MFC WMAver[facility]9/01/2021  3:45 PM WMC-MFC US6 WMC-MFCUS WMC    TaDonnamae JudeMD

## 2021-09-06 NOTE — Progress Notes (Signed)
Pt informed that the ultrasound is considered a limited OB ultrasound and is not intended to be a complete ultrasound exam.  Patient also informed that the ultrasound is not being completed with the intent of assessing for fetal or placental anomalies or any pelvic abnormalities.  Explained that the purpose of today's ultrasound is to assess for  BPP, presentation, and AFI.  Patient acknowledges the purpose of the exam and the limitations of the study.     Koren Bound RN BSN 09/06/21

## 2021-09-07 ENCOUNTER — Other Ambulatory Visit: Payer: Self-pay | Admitting: *Deleted

## 2021-09-07 DIAGNOSIS — Z8759 Personal history of other complications of pregnancy, childbirth and the puerperium: Secondary | ICD-10-CM

## 2021-09-07 DIAGNOSIS — O24410 Gestational diabetes mellitus in pregnancy, diet controlled: Secondary | ICD-10-CM

## 2021-09-07 DIAGNOSIS — O09523 Supervision of elderly multigravida, third trimester: Secondary | ICD-10-CM

## 2021-09-10 NOTE — Progress Notes (Signed)
NST:  Baseline: 135 bpm, Variability: Good {> 6 bpm), Accelerations: Reactive, and Decelerations: Absent

## 2021-09-11 ENCOUNTER — Encounter: Payer: Self-pay | Admitting: Physical Therapy

## 2021-09-13 ENCOUNTER — Other Ambulatory Visit: Payer: Self-pay

## 2021-09-13 ENCOUNTER — Ambulatory Visit (INDEPENDENT_AMBULATORY_CARE_PROVIDER_SITE_OTHER): Payer: Self-pay

## 2021-09-13 ENCOUNTER — Ambulatory Visit: Payer: Self-pay | Admitting: *Deleted

## 2021-09-13 VITALS — BP 114/65 | HR 82

## 2021-09-13 DIAGNOSIS — Z8759 Personal history of other complications of pregnancy, childbirth and the puerperium: Secondary | ICD-10-CM

## 2021-09-13 DIAGNOSIS — O24415 Gestational diabetes mellitus in pregnancy, controlled by oral hypoglycemic drugs: Secondary | ICD-10-CM

## 2021-09-13 NOTE — Progress Notes (Signed)

## 2021-09-14 ENCOUNTER — Other Ambulatory Visit: Payer: Self-pay

## 2021-09-18 ENCOUNTER — Encounter: Payer: Self-pay | Admitting: Physical Therapy

## 2021-09-20 ENCOUNTER — Ambulatory Visit: Payer: Self-pay | Attending: Obstetrics and Gynecology

## 2021-09-20 ENCOUNTER — Other Ambulatory Visit: Payer: Self-pay | Admitting: *Deleted

## 2021-09-20 ENCOUNTER — Ambulatory Visit: Payer: Self-pay | Admitting: *Deleted

## 2021-09-20 VITALS — BP 122/67 | HR 79

## 2021-09-20 DIAGNOSIS — O24410 Gestational diabetes mellitus in pregnancy, diet controlled: Secondary | ICD-10-CM | POA: Insufficient documentation

## 2021-09-20 DIAGNOSIS — O09521 Supervision of elderly multigravida, first trimester: Secondary | ICD-10-CM | POA: Insufficient documentation

## 2021-09-20 DIAGNOSIS — Z8759 Personal history of other complications of pregnancy, childbirth and the puerperium: Secondary | ICD-10-CM | POA: Insufficient documentation

## 2021-09-20 DIAGNOSIS — O0990 Supervision of high risk pregnancy, unspecified, unspecified trimester: Secondary | ICD-10-CM | POA: Insufficient documentation

## 2021-09-20 DIAGNOSIS — O09523 Supervision of elderly multigravida, third trimester: Secondary | ICD-10-CM

## 2021-09-20 DIAGNOSIS — O34219 Maternal care for unspecified type scar from previous cesarean delivery: Secondary | ICD-10-CM

## 2021-09-20 DIAGNOSIS — O24415 Gestational diabetes mellitus in pregnancy, controlled by oral hypoglycemic drugs: Secondary | ICD-10-CM

## 2021-09-21 ENCOUNTER — Other Ambulatory Visit: Payer: Self-pay

## 2021-09-21 ENCOUNTER — Ambulatory Visit (INDEPENDENT_AMBULATORY_CARE_PROVIDER_SITE_OTHER): Payer: Self-pay | Admitting: Obstetrics & Gynecology

## 2021-09-21 VITALS — BP 117/81 | HR 94 | Wt 167.0 lb

## 2021-09-21 DIAGNOSIS — Z8759 Personal history of other complications of pregnancy, childbirth and the puerperium: Secondary | ICD-10-CM

## 2021-09-21 DIAGNOSIS — O34219 Maternal care for unspecified type scar from previous cesarean delivery: Secondary | ICD-10-CM

## 2021-09-21 DIAGNOSIS — Z98891 History of uterine scar from previous surgery: Secondary | ICD-10-CM

## 2021-09-21 DIAGNOSIS — O09521 Supervision of elderly multigravida, first trimester: Secondary | ICD-10-CM

## 2021-09-21 DIAGNOSIS — O24415 Gestational diabetes mellitus in pregnancy, controlled by oral hypoglycemic drugs: Secondary | ICD-10-CM

## 2021-09-21 NOTE — Progress Notes (Signed)
   PRENATAL VISIT NOTE  Subjective:  Brittany Knapp is a 41 y.o. [phone] at 82w1dbeing seen today for ongoing prenatal care.  She is currently monitored for the following issues for this high-risk pregnancy and has Supervision of high risk pregnancy, antepartum; AMA (advanced maternal age) multigravida 35+, first trimester; Prediabetes; Previous cesarean delivery affecting pregnancy, antepartum; History of IUFD at 334 weeks GDM (gestational diabetes mellitus); and History of VBAC on their problem list.  Patient reports no complaints.  Contractions: Not present. Vag. Bleeding: None.  Movement: Present. Denies leaking of fluid.   The following portions of the patient's history were reviewed and updated as appropriate: allergies, current medications, past family history, past medical history, past social history, past surgical history and problem list.   Objective:   Vitals:   09/21/21 0822  BP: 117/81  Pulse: 94  Weight: 167 lb (75.8 kg)    Fetal Status: Fetal Heart Rate (bpm): 145   Movement: Present     General:  Alert, oriented and cooperative. Patient is in no acute distress.  Skin: Skin is warm and dry. No rash noted.   Cardiovascular: Normal heart rate noted  Respiratory: Normal respiratory effort, no problems with respiration noted  Abdomen: Soft, gravid, appropriate for gestational age.  Pain/Pressure: Present     Pelvic: Cervical exam deferred        Extremities: Normal range of motion.  Edema: None  Mental Status: Normal mood and affect. Normal behavior. Normal judgment and thought content.   Assessment and Plan:  Pregnancy: [id number] 321w1d. History of IUFD at 31 weeks Weekly fetal testing  2. History of VBAC Plans repeat CS  3. Previous cesarean delivery affecting pregnancy, antepartum   4. Gestational diabetes mellitus (GDM) in third trimester controlled on oral hypoglycemic drug FBS 90% in range and PP in range as well  5. AMA (advanced maternal age)  multigravida 3534+first trimester 89.o.   Preterm labor symptoms and general obstetric precautions including but not limited to vaginal bleeding, contractions, leaking of fluid and fetal movement were reviewed in detail with the patient. Please refer to After Visit Summary for other counseling recommendations.   Return in about 1 week (around 09/28/2021).  Future Appointments  Date Time Provider [place]9/01/2021  3:30 PM [facility] NURSE [facility]2021  3:45 PM WMC-MFC US6 [facility]9/07/2021  8:30 AM [facility] NURSE [facility]/07/2021  8:45 AM [facility]MFC US7 [facility]9/07/2021  1:15 PM [facility] NST [facility]9/14/2023 10:15 AM [facility] NST [facility]9/14/2023 11:15 AM PiAletha HalimMD [facility]9/15/2023  2:15 PM W[facility]MFC NURSE [facility]9/15/2023  2:30 PM WMC-MFC US3 [facility]MFCUS WMC[facility]  JaEmeterio ReeveMD

## 2021-09-25 ENCOUNTER — Telehealth: Payer: Self-pay | Admitting: *Deleted

## 2021-09-26 ENCOUNTER — Other Ambulatory Visit: Payer: Self-pay | Admitting: Certified Nurse Midwife

## 2021-09-26 DIAGNOSIS — K219 Gastro-esophageal reflux disease without esophagitis: Secondary | ICD-10-CM

## 2021-09-28 ENCOUNTER — Ambulatory Visit: Payer: Self-pay | Admitting: *Deleted

## 2021-09-28 ENCOUNTER — Encounter: Payer: Self-pay | Admitting: *Deleted

## 2021-09-28 ENCOUNTER — Ambulatory Visit: Payer: Self-pay | Attending: Obstetrics

## 2021-09-28 VITALS — BP 117/70 | HR 84

## 2021-09-28 DIAGNOSIS — O0990 Supervision of high risk pregnancy, unspecified, unspecified trimester: Secondary | ICD-10-CM | POA: Insufficient documentation

## 2021-09-28 DIAGNOSIS — O09293 Supervision of pregnancy with other poor reproductive or obstetric history, third trimester: Secondary | ICD-10-CM

## 2021-09-28 DIAGNOSIS — O24410 Gestational diabetes mellitus in pregnancy, diet controlled: Secondary | ICD-10-CM | POA: Insufficient documentation

## 2021-09-28 DIAGNOSIS — Z8759 Personal history of other complications of pregnancy, childbirth and the puerperium: Secondary | ICD-10-CM | POA: Insufficient documentation

## 2021-09-28 DIAGNOSIS — Z3A36 36 weeks gestation of pregnancy: Secondary | ICD-10-CM

## 2021-09-28 DIAGNOSIS — O09523 Supervision of elderly multigravida, third trimester: Secondary | ICD-10-CM | POA: Insufficient documentation

## 2021-09-28 DIAGNOSIS — O403XX0 Polyhydramnios, third trimester, not applicable or unspecified: Secondary | ICD-10-CM

## 2021-09-28 DIAGNOSIS — O09521 Supervision of elderly multigravida, first trimester: Secondary | ICD-10-CM | POA: Insufficient documentation

## 2021-10-02 ENCOUNTER — Other Ambulatory Visit: Payer: Self-pay | Admitting: *Deleted

## 2021-10-02 DIAGNOSIS — O24415 Gestational diabetes mellitus in pregnancy, controlled by oral hypoglycemic drugs: Secondary | ICD-10-CM

## 2021-10-03 ENCOUNTER — Inpatient Hospital Stay (HOSPITAL_COMMUNITY)
Admission: AD | Admit: 2021-10-03 | Discharge: 2021-10-03 | Disposition: A | Discharge status: Home / Self Care | Payer: Self-pay | Attending: Obstetrics and Gynecology | Admitting: Obstetrics and Gynecology

## 2021-10-03 ENCOUNTER — Encounter (HOSPITAL_COMMUNITY): Payer: Self-pay | Admitting: Obstetrics and Gynecology

## 2021-10-03 DIAGNOSIS — O0990 Supervision of high risk pregnancy, unspecified, unspecified trimester: Secondary | ICD-10-CM

## 2021-10-03 DIAGNOSIS — O0993 Supervision of high risk pregnancy, unspecified, third trimester: Secondary | ICD-10-CM | POA: Insufficient documentation

## 2021-10-03 DIAGNOSIS — O4703 False labor before 37 completed weeks of gestation, third trimester: Secondary | ICD-10-CM | POA: Insufficient documentation

## 2021-10-03 DIAGNOSIS — Z3A36 36 weeks gestation of pregnancy: Secondary | ICD-10-CM | POA: Insufficient documentation

## 2021-10-03 DIAGNOSIS — O09523 Supervision of elderly multigravida, third trimester: Secondary | ICD-10-CM | POA: Insufficient documentation

## 2021-10-03 DIAGNOSIS — O479 False labor, unspecified: Secondary | ICD-10-CM

## 2021-10-03 DIAGNOSIS — O09521 Supervision of elderly multigravida, first trimester: Secondary | ICD-10-CM

## 2021-10-03 MED ORDER — LACTATED RINGERS IV BOLUS
1000.0000 mL | Freq: Once | INTRAVENOUS | Status: AC
Start: 2021-10-03 — End: 2021-10-03
  Administered 2021-10-03: 1000 mL via INTRAVENOUS

## 2021-10-03 MED ORDER — LACTATED RINGERS IV BOLUS
1000.0000 mL | Freq: Once | INTRAVENOUS | Status: AC
  Administered 2021-10-03: 1000 mL via INTRAVENOUS

## 2021-10-03 MED ORDER — MORPHINE SULFATE (PF) 4 MG/ML IV SOLN
2.0000 mg | Freq: Once | INTRAVENOUS | Status: AC
  Administered 2021-10-03: 2 mg via INTRAVENOUS
  Filled 2021-10-03: qty 1

## 2021-10-03 NOTE — MAU Provider Note (Signed)
Event Date/Time   First Provider Initiated Contact with Patient 10/03/21 1054       S: [name] Name is a 41 y.o. [phone] at [redacted]w[redacted]d who presents to MAU today complaining contractions q 2-3 minutes since 1am this morning. She denies vaginal bleeding. She denies LOF. She reports normal fetal movement.    O: BP 126/80 (BP Location: Right Arm)   Pulse 77   Temp 98.2 F (36.8 C) (Oral)   Resp 14   Wt 74 kg   LMP 01/18/2021   SpO2 96%   BMI 28.01 kg/m  GENERAL: Well-developed, well-nourished female in no acute distress.  HEAD: Normocephalic, atraumatic.  CHEST: Normal effort of breathing, regular heart rate ABDOMEN: Soft, nontender, gravid  Cervical exam:  Dilation: 1.5 Effacement (%): 50 Cervical Position: Posterior Station: Ballotable Presentation: Vertex Exam by:: Holly Flippin RN   Fetal Monitoring: Baseline: 135 bpm Variability: moderate variability  Accelerations: Present 15x15  Decelerations: occasional early.  Contractions: every 2-4 mins.    A: SIUP at 379w6dSuspicion for spontaneous onset of labor. Given IV fluid bolus and repeat exam in 1-2hours to assess for cervical change. If laboring plan for repeat c/s.   Reassessment @ 1230.  Dilation: 1.5 Effacement (%): 50 Cervical Position: Posterior Station: Ballotable Presentation: Vertex Exam by:: HoEarnest Baileylippin RN  - Cervical exam unchanged.  - Patient complaining of pain 7/10. -Therapeutic rest offered and plan to reassess.  - '4mg'$  IV morphine and 2nd bag of LR ordered. - Reassess in 2 hours.   FHR: remains Cat I   Reassessment @ 1430:  Dilation: 1.5 Effacement (%): 50 Cervical Position: Posterior Station: Ballotable Presentation: Vertex Exam by:: Holly Flippin RN  FHT: 130bpm with moderate variability 15x15 accels and no decels.  Contractions spaced to q5-6 mins.    Cervix remains unchanged.  Plan for discharge  P: 1. Supervision of high risk pregnancy, antepartum   2. AMA (advanced  maternal age) multigravida 359+first trimester   3. False labor   4. [redacted] weeks gestation of pregnancy    - Discussed that this is early/false labor and discussed expectations that accompany early labor.  - Discussed worsening signs and symptoms that prompt returning to MAU.  - Preterm labor precautions reviewed.  - FHT Cat I upon discharge  - Patient discharged home in stable condition and may return to MAU as needed.   PaDeloris PingCNM 10/03/2021 10:54 AM

## 2021-10-03 NOTE — MAU Note (Signed)
.  Brittany Knapp is a 41 y.o. at 54w6dhere in MAU reporting: ctx since 0100, states they are now every 5 minutes. Denies VB or LOF. +FM. Plans repeat c/s. NPO since 2000 on 10/02/21.  Pain score: 5

## 2021-10-04 ENCOUNTER — Ambulatory Visit (INDEPENDENT_AMBULATORY_CARE_PROVIDER_SITE_OTHER): Payer: Self-pay | Admitting: Family Medicine

## 2021-10-04 ENCOUNTER — Other Ambulatory Visit: Payer: Self-pay

## 2021-10-04 ENCOUNTER — Other Ambulatory Visit (HOSPITAL_COMMUNITY)
Admission: RE | Admit: 2021-10-04 | Discharge: 2021-10-04 | Disposition: A | Discharge status: Home / Self Care | Payer: Self-pay | Source: Ambulatory Visit | Attending: Family Medicine | Admitting: Family Medicine

## 2021-10-04 ENCOUNTER — Ambulatory Visit: Payer: Self-pay

## 2021-10-04 ENCOUNTER — Ambulatory Visit (INDEPENDENT_AMBULATORY_CARE_PROVIDER_SITE_OTHER): Payer: Self-pay

## 2021-10-04 VITALS — BP 115/72 | HR 78 | Wt 171.2 lb

## 2021-10-04 DIAGNOSIS — O09521 Supervision of elderly multigravida, first trimester: Secondary | ICD-10-CM

## 2021-10-04 DIAGNOSIS — Z98891 History of uterine scar from previous surgery: Secondary | ICD-10-CM

## 2021-10-04 DIAGNOSIS — O0990 Supervision of high risk pregnancy, unspecified, unspecified trimester: Secondary | ICD-10-CM

## 2021-10-04 DIAGNOSIS — O24415 Gestational diabetes mellitus in pregnancy, controlled by oral hypoglycemic drugs: Secondary | ICD-10-CM

## 2021-10-04 DIAGNOSIS — O09523 Supervision of elderly multigravida, third trimester: Secondary | ICD-10-CM

## 2021-10-04 DIAGNOSIS — O34219 Maternal care for unspecified type scar from previous cesarean delivery: Secondary | ICD-10-CM

## 2021-10-04 DIAGNOSIS — Z3A37 37 weeks gestation of pregnancy: Secondary | ICD-10-CM

## 2021-10-04 DIAGNOSIS — Z8759 Personal history of other complications of pregnancy, childbirth and the puerperium: Secondary | ICD-10-CM

## 2021-10-04 NOTE — Progress Notes (Signed)
   PRENATAL VISIT NOTE  Subjective:  Brittany Knapp is a 41 y.o. [phone] at 12w0dbeing seen today for ongoing prenatal care.  She is currently monitored for the following issues for this high-risk pregnancy and has Supervision of high risk pregnancy, antepartum; AMA (advanced maternal age) multigravida 35+, first trimester; Prediabetes; Previous cesarean delivery affecting pregnancy, antepartum; History of IUFD at 361 weeks GDM (gestational diabetes mellitus); and History of VBAC on their problem list.  Patient reports no complaints.  Contractions: Irregular.  .  Movement: Present. Denies leaking of fluid.   The following portions of the patient's history were reviewed and updated as appropriate: allergies, current medications, past family history, past medical history, past social history, past surgical history and problem list.   Objective:   Vitals:   10/04/21 1321  BP: 115/72  Pulse: 78  Weight: 171 lb 3.2 oz (77.7 kg)    Fetal Status: Fetal Heart Rate (bpm): RNST   Movement: Present  Presentation: Vertex  General:  Alert, oriented and cooperative. Patient is in no acute distress.  Skin: Skin is warm and dry. No rash noted.   Cardiovascular: Normal heart rate noted  Respiratory: Normal respiratory effort, no problems with respiration noted  Abdomen: Soft, gravid, appropriate for gestational age.  Pain/Pressure: Present     Pelvic: Cervical exam performed in the presence of a chaperone        Extremities: Normal range of motion.     Mental Status: Normal mood and affect. Normal behavior. Normal judgment and thought content.  NST:  Baseline: 135 bpm, Variability: Good {> 6 bpm), Accelerations: Reactive, and Decelerations: Absent  Assessment and Plan:  Pregnancy: G4P2102 at 367w0d. Gestational diabetes mellitus (GDM) in third trimester controlled on oral hypoglycemic drug On metformin, no book, CBGs are normal per her report Last growth at 47% - [place] BPP W/NONSTRESS;  Future  2. AMA (advanced maternal age) multigravida 3531+third trimester LR NIPT - [place] B[place] W/NONSTRESS; Future  3. History of IUFD - [place] B[place] W/NONSTRESS; Future  4. Supervision of high risk pregnancy, antepartum - GC/Chlamydia probe amp ([place])[facility] Strep Gp B NAA  5. Previous cesarean delivery affecting pregnancy, antepartum Desires RCS and BTL    Preterm labor symptoms and general obstetric precautions including but not limited to vaginal bleeding, contractions, leaking of fluid and fetal movement were reviewed in detail with the patient. Please refer to After Visit Summary for other counseling recommendations.   Return in about 1 week (around 10/11/2021) [facility] as [facility].  Future Appointments  Date Time Provider [place]/14/2023 10:15 AM WMC[facility]WOCA NST [facility]9/14/2023 11:15 AM PiAletha HalimMD WMChi [facility]9/20/2023 12:30 PM WMC-MFC NURSE [facility]MS[facility]9/20/2023 12:45 PM WMC-MFC US5 WMC-MFCUS WMC    TaDonnamae JudeMD

## 2021-10-04 NOTE — Progress Notes (Signed)

## 2021-10-06 LAB — STREP GP B NAA: Strep Gp B NAA: NEGATIVE

## 2021-10-08 NOTE — Patient Instructions (Signed)
Brittany Knapp  10/08/2021   Your procedure is scheduled on:  10/18/2021  Arrive at 51 at Entrance C on [address] at [facility]  and [address]. You are invited to use the FREE valet parking or use the Visitor's parking deck.  Pick up the phone at the desk and dial [phone].  Call this number if you have problems the morning of surgery: [phone]  Remember:   Do not eat food:(After Midnight) Desps de medianoche.  Do not drink clear liquids: (After Midnight) Desps de medianoche.  Take these medicines the morning of surgery with A SIP OF WATER:  May take protonix as prescribed   Do not wear jewelry, make-up or nail polish.  Do not wear lotions, powders, or perfumes. Do not wear deodorant.  Do not shave 48 hours prior to surgery.  Do not bring valuables to the hospital.  [facility] is not   responsible for any belongings or valuables brought to the hospital.  Contacts, dentures or bridgework may not be worn into surgery.  Leave suitcase in the car. After surgery it may be brought to your room.  For patients admitted to the hospital, checkout time is 11:00 AM the day of              discharge.      Please read over the following fact sheets that you were given:     Preparing for Surgery

## 2021-10-09 ENCOUNTER — Encounter (HOSPITAL_COMMUNITY): Payer: Self-pay

## 2021-10-09 LAB — GC/CHLAMYDIA PROBE AMP (~~LOC~~) NOT AT ARMC
Chlamydia: NEGATIVE
Comment: NEGATIVE
Comment: NORMAL
Neisseria Gonorrhea: NEGATIVE

## 2021-10-09 NOTE — Pre-Procedure Instructions (Signed)
[id number] interpreter number

## 2021-10-11 ENCOUNTER — Ambulatory Visit (INDEPENDENT_AMBULATORY_CARE_PROVIDER_SITE_OTHER): Payer: Self-pay

## 2021-10-11 ENCOUNTER — Ambulatory Visit (INDEPENDENT_AMBULATORY_CARE_PROVIDER_SITE_OTHER): Payer: Self-pay | Admitting: Obstetrics and Gynecology

## 2021-10-11 ENCOUNTER — Other Ambulatory Visit: Payer: Self-pay

## 2021-10-11 ENCOUNTER — Ambulatory Visit: Payer: Self-pay | Admitting: *Deleted

## 2021-10-11 VITALS — BP 112/66 | HR 72 | Wt 172.3 lb

## 2021-10-11 DIAGNOSIS — O09523 Supervision of elderly multigravida, third trimester: Secondary | ICD-10-CM

## 2021-10-11 DIAGNOSIS — O0990 Supervision of high risk pregnancy, unspecified, unspecified trimester: Secondary | ICD-10-CM

## 2021-10-11 DIAGNOSIS — O24415 Gestational diabetes mellitus in pregnancy, controlled by oral hypoglycemic drugs: Secondary | ICD-10-CM

## 2021-10-11 DIAGNOSIS — O09521 Supervision of elderly multigravida, first trimester: Secondary | ICD-10-CM

## 2021-10-11 DIAGNOSIS — O34219 Maternal care for unspecified type scar from previous cesarean delivery: Secondary | ICD-10-CM

## 2021-10-11 DIAGNOSIS — Z8759 Personal history of other complications of pregnancy, childbirth and the puerperium: Secondary | ICD-10-CM

## 2021-10-11 DIAGNOSIS — O0993 Supervision of high risk pregnancy, unspecified, third trimester: Secondary | ICD-10-CM

## 2021-10-11 DIAGNOSIS — Z3A38 38 weeks gestation of pregnancy: Secondary | ICD-10-CM

## 2021-10-11 NOTE — Progress Notes (Signed)
   PRENATAL VISIT NOTE  Subjective:  Brittany Knapp is a 41 y.o. [phone] at 2w0dbeing seen today for ongoing prenatal care.  She is currently monitored for the following issues for this high-risk pregnancy and has Supervision of high risk pregnancy, antepartum; AMA (advanced maternal age) multigravida 35+, first trimester; Prediabetes; Previous cesarean delivery affecting pregnancy, antepartum; History of IUFD at 359 weeks GDM (gestational diabetes mellitus); and History of VBAC on their problem list.  Patient reports no complaints.  Contractions: Irregular. Vag. Bleeding: None.  Movement: Present. Denies leaking of fluid.   The following portions of the patient's history were reviewed and updated as appropriate: allergies, current medications, past family history, past medical history, past social history, past surgical history and problem list.   Objective:   Vitals:   10/11/21 1110  BP: 112/66  Pulse: 72  Weight: 172 lb 4.8 oz (78.2 kg)    Fetal Status: Fetal Heart Rate (bpm): RNST   Movement: Present     General:  Alert, oriented and cooperative. Patient is in no acute distress.  Skin: Skin is warm and dry. No rash noted.   Cardiovascular: Normal heart rate noted  Respiratory: Normal respiratory effort, no problems with respiration noted  Abdomen: Soft, gravid, appropriate for gestational age.  Pain/Pressure: Present     Pelvic: Cervical exam deferred        Extremities: Normal range of motion.     Mental Status: Normal mood and affect. Normal behavior. Normal judgment and thought content.   Assessment and Plan:  Pregnancy: [id number] 348w0d. Supervision of high risk pregnancy, antepartum Desires BTL. D/w her and may get extra charge for this, she states she's already paid for it.   2. Gestational diabetes mellitus (GDM) in third trimester controlled on oral hypoglycemic drug On metfomrin 500 qhs. Normal CBGs.  Bpp 1014/43cephalic, afi 16 9/1: 4715%ac 65, efw 2816gm No need  for further testing  3. AMA (advanced maternal age) multigravida 3567+third trimester  4. History of IUFD  5. Previous cesarean delivery affecting pregnancy, antepartum For rpt and btl on 9/21  Term labor symptoms and general obstetric precautions including but not limited to vaginal bleeding, contractions, leaking of fluid and fetal movement were reviewed in detail with the patient. Please refer to After Visit Summary for other counseling recommendations.   Return in 2 weeks (on 10/25/2021) for C/S on 9/21>>needs 7-10d incision check after this. .  Future Appointments  Date Time Provider [place]9/14/2023 11:35 AM WMC-CWH US1 [facility]9/19/2023  9:30 AM MC-LD PAT 1 MC-INDC None    ChAletha HalimMD

## 2021-10-11 NOTE — Progress Notes (Signed)
Rpt C/S scheduled on 9/21.

## 2021-10-12 ENCOUNTER — Ambulatory Visit: Payer: Self-pay

## 2021-10-15 ENCOUNTER — Other Ambulatory Visit: Payer: Self-pay | Admitting: Family Medicine

## 2021-10-15 DIAGNOSIS — O34219 Maternal care for unspecified type scar from previous cesarean delivery: Secondary | ICD-10-CM

## 2021-10-15 DIAGNOSIS — O0990 Supervision of high risk pregnancy, unspecified, unspecified trimester: Secondary | ICD-10-CM

## 2021-10-16 ENCOUNTER — Encounter (HOSPITAL_COMMUNITY)
Admission: RE | Admit: 2021-10-16 | Discharge: 2021-10-16 | Disposition: A | Discharge status: Home / Self Care | Payer: Self-pay | Source: Ambulatory Visit | Attending: Obstetrics & Gynecology | Admitting: Obstetrics & Gynecology

## 2021-10-16 DIAGNOSIS — Z3A38 38 weeks gestation of pregnancy: Secondary | ICD-10-CM | POA: Insufficient documentation

## 2021-10-16 DIAGNOSIS — Z01812 Encounter for preprocedural laboratory examination: Secondary | ICD-10-CM | POA: Insufficient documentation

## 2021-10-16 DIAGNOSIS — O34219 Maternal care for unspecified type scar from previous cesarean delivery: Secondary | ICD-10-CM | POA: Insufficient documentation

## 2021-10-16 DIAGNOSIS — O0990 Supervision of high risk pregnancy, unspecified, unspecified trimester: Secondary | ICD-10-CM | POA: Insufficient documentation

## 2021-10-16 LAB — CBC
HCT: 37.9 % (ref 36.0–46.0)
Hemoglobin: 12.6 g/dL (ref 12.0–15.0)
MCH: 26.1 pg (ref 26.0–34.0)
MCHC: 33.2 g/dL (ref 30.0–36.0)
MCV: 78.5 fL — ABNORMAL LOW (ref 80.0–100.0)
Platelets: 165 10*3/uL (ref 150–400)
RBC: 4.83 MIL/uL (ref 3.87–5.11)
RDW: 14.9 % (ref 11.5–15.5)
WBC: 7.7 10*3/uL (ref 4.0–10.5)
nRBC: 0 % (ref 0.0–0.2)

## 2021-10-16 LAB — RAPID HIV SCREEN (HIV 1/2 AB+AG)
HIV 1/2 Antibodies: NONREACTIVE
HIV-1 P24 Antigen - HIV24: NONREACTIVE

## 2021-10-16 LAB — TYPE AND SCREEN
ABO/RH(D): A POS
Antibody Screen: NEGATIVE

## 2021-10-17 ENCOUNTER — Ambulatory Visit: Payer: Self-pay

## 2021-10-17 LAB — RPR: RPR Ser Ql: NONREACTIVE

## 2021-10-18 ENCOUNTER — Inpatient Hospital Stay (HOSPITAL_COMMUNITY)
Admission: RE | Admit: 2021-10-18 | Discharge: 2021-10-20 | DRG: 785 | Disposition: A | Discharge status: Home / Self Care | Payer: Medicaid Other | Attending: Family Medicine | Admitting: Family Medicine

## 2021-10-18 ENCOUNTER — Encounter (HOSPITAL_COMMUNITY): Payer: Self-pay | Admitting: Family Medicine

## 2021-10-18 ENCOUNTER — Inpatient Hospital Stay (HOSPITAL_COMMUNITY): Payer: Medicaid Other | Admitting: Anesthesiology

## 2021-10-18 ENCOUNTER — Encounter (HOSPITAL_COMMUNITY)
Admission: RE | Disposition: A | Discharge status: Home / Self Care | Payer: Self-pay | Source: Home / Self Care | Attending: Family Medicine

## 2021-10-18 ENCOUNTER — Other Ambulatory Visit: Payer: Self-pay

## 2021-10-18 DIAGNOSIS — O34211 Maternal care for low transverse scar from previous cesarean delivery: Secondary | ICD-10-CM

## 2021-10-18 DIAGNOSIS — O24420 Gestational diabetes mellitus in childbirth, diet controlled: Secondary | ICD-10-CM | POA: Diagnosis present

## 2021-10-18 DIAGNOSIS — Z23 Encounter for immunization: Secondary | ICD-10-CM | POA: Diagnosis not present

## 2021-10-18 DIAGNOSIS — Z3A39 39 weeks gestation of pregnancy: Secondary | ICD-10-CM

## 2021-10-18 DIAGNOSIS — O24425 Gestational diabetes mellitus in childbirth, controlled by oral hypoglycemic drugs: Secondary | ICD-10-CM

## 2021-10-18 DIAGNOSIS — O0990 Supervision of high risk pregnancy, unspecified, unspecified trimester: Secondary | ICD-10-CM

## 2021-10-18 DIAGNOSIS — Z302 Encounter for sterilization: Secondary | ICD-10-CM

## 2021-10-18 DIAGNOSIS — O99214 Obesity complicating childbirth: Secondary | ICD-10-CM | POA: Diagnosis present

## 2021-10-18 DIAGNOSIS — R7303 Prediabetes: Principal | ICD-10-CM | POA: Diagnosis present

## 2021-10-18 DIAGNOSIS — O24419 Gestational diabetes mellitus in pregnancy, unspecified control: Secondary | ICD-10-CM | POA: Diagnosis present

## 2021-10-18 DIAGNOSIS — O9902 Anemia complicating childbirth: Secondary | ICD-10-CM | POA: Diagnosis present

## 2021-10-18 DIAGNOSIS — Z8759 Personal history of other complications of pregnancy, childbirth and the puerperium: Secondary | ICD-10-CM

## 2021-10-18 DIAGNOSIS — Z98891 History of uterine scar from previous surgery: Secondary | ICD-10-CM

## 2021-10-18 DIAGNOSIS — O34219 Maternal care for unspecified type scar from previous cesarean delivery: Secondary | ICD-10-CM | POA: Diagnosis present

## 2021-10-18 LAB — GLUCOSE, CAPILLARY
Glucose-Capillary: 75 mg/dL (ref 70–99)
Glucose-Capillary: 75 mg/dL (ref 70–99)

## 2021-10-18 SURGERY — Surgical Case
Anesthesia: Spinal | Laterality: Bilateral

## 2021-10-18 MED ORDER — SOD CITRATE-CITRIC ACID 500-334 MG/5ML PO SOLN: 30.0000 mL | Freq: Once | ORAL | Status: DC

## 2021-10-18 MED ORDER — OXYCODONE HCL 5 MG PO TABS: 5.0000 mg | ORAL_TABLET | Freq: Once | ORAL | Status: DC | PRN

## 2021-10-18 MED ORDER — CHLORHEXIDINE GLUCONATE 0.12 % MT SOLN: 15.0000 mL | Freq: Once | OROMUCOSAL | Status: AC

## 2021-10-18 MED ORDER — LACTATED RINGERS IV SOLN: INTRAVENOUS | Status: DC

## 2021-10-18 MED ORDER — KETOROLAC TROMETHAMINE 30 MG/ML IJ SOLN: 30.0000 mg | Freq: Four times a day (QID) | INTRAMUSCULAR | Status: AC | PRN

## 2021-10-18 MED ORDER — KETOROLAC TROMETHAMINE 30 MG/ML IJ SOLN
30.0000 mg | Freq: Four times a day (QID) | INTRAMUSCULAR | Status: AC
  Administered 2021-10-18 – 2021-10-19 (×3): 30 mg via INTRAVENOUS
  Filled 2021-10-18 (×3): qty 1

## 2021-10-18 MED ORDER — NALOXONE HCL 4 MG/10ML IJ SOLN: 1.0000 ug/kg/h | INTRAVENOUS | Status: DC | PRN

## 2021-10-18 MED ORDER — PRENATAL MULTIVITAMIN CH
1.0000 | ORAL_TABLET | Freq: Every day | ORAL | Status: DC
  Administered 2021-10-19 – 2021-10-20 (×2): 1 via ORAL
  Filled 2021-10-18 (×2): qty 1

## 2021-10-18 MED ORDER — MORPHINE SULFATE (PF) 0.5 MG/ML IJ SOLN
INTRAMUSCULAR | Status: DC | PRN
  Administered 2021-10-18: 150 ug via INTRATHECAL

## 2021-10-18 MED ORDER — INFLUENZA VAC SPLIT QUAD 0.5 ML IM SUSY
0.5000 mL | PREFILLED_SYRINGE | INTRAMUSCULAR | Status: AC
  Administered 2021-10-19: 0.5 mL via INTRAMUSCULAR
  Filled 2021-10-18: qty 0.5

## 2021-10-18 MED ORDER — FENTANYL CITRATE (PF) 100 MCG/2ML IJ SOLN: 25.0000 ug | INTRAMUSCULAR | Status: DC | PRN

## 2021-10-18 MED ORDER — DIBUCAINE (PERIANAL) 1 % EX OINT: 1.0000 | TOPICAL_OINTMENT | CUTANEOUS | Status: DC | PRN

## 2021-10-18 MED ORDER — WITCH HAZEL-GLYCERIN EX PADS: 1.0000 | MEDICATED_PAD | CUTANEOUS | Status: DC | PRN

## 2021-10-18 MED ORDER — BUPIVACAINE IN DEXTROSE 0.75-8.25 % IT SOLN
INTRATHECAL | Status: DC | PRN
  Administered 2021-10-18: 1.6 mL via INTRATHECAL

## 2021-10-18 MED ORDER — KETOROLAC TROMETHAMINE 30 MG/ML IJ SOLN
30.0000 mg | Freq: Four times a day (QID) | INTRAMUSCULAR | Status: AC | PRN
  Administered 2021-10-18: 30 mg via INTRAVENOUS

## 2021-10-18 MED ORDER — PHENYLEPHRINE HCL-NACL 20-0.9 MG/250ML-% IV SOLN
INTRAVENOUS | Status: AC
  Filled 2021-10-18: qty 500

## 2021-10-18 MED ORDER — DIPHENHYDRAMINE HCL 50 MG/ML IJ SOLN: 12.5000 mg | INTRAMUSCULAR | Status: DC | PRN

## 2021-10-18 MED ORDER — ORAL CARE MOUTH RINSE
15.0000 mL | Freq: Once | OROMUCOSAL | Status: AC
  Administered 2021-10-18: 15 mL via OROMUCOSAL

## 2021-10-18 MED ORDER — SCOPOLAMINE 1 MG/3DAYS TD PT72: 1.0000 | MEDICATED_PATCH | Freq: Once | TRANSDERMAL | Status: DC

## 2021-10-18 MED ORDER — SCOPOLAMINE 1 MG/3DAYS TD PT72
MEDICATED_PATCH | TRANSDERMAL | Status: AC
  Filled 2021-10-18: qty 1

## 2021-10-18 MED ORDER — IBUPROFEN 600 MG PO TABS
600.0000 mg | ORAL_TABLET | Freq: Four times a day (QID) | ORAL | Status: DC
  Administered 2021-10-19 – 2021-10-20 (×4): 600 mg via ORAL
  Filled 2021-10-18 (×5): qty 1

## 2021-10-18 MED ORDER — KETOROLAC TROMETHAMINE 30 MG/ML IJ SOLN
INTRAMUSCULAR | Status: AC
  Filled 2021-10-18: qty 1

## 2021-10-18 MED ORDER — ONDANSETRON HCL 4 MG/2ML IJ SOLN
INTRAMUSCULAR | Status: AC
  Filled 2021-10-18: qty 4

## 2021-10-18 MED ORDER — CEFAZOLIN SODIUM-DEXTROSE 2-4 GM/100ML-% IV SOLN
2.0000 g | INTRAVENOUS | Status: AC
  Administered 2021-10-18: 2 g via INTRAVENOUS

## 2021-10-18 MED ORDER — SENNOSIDES-DOCUSATE SODIUM 8.6-50 MG PO TABS
2.0000 | ORAL_TABLET | Freq: Every day | ORAL | Status: DC
  Administered 2021-10-19 – 2021-10-20 (×2): 2 via ORAL
  Filled 2021-10-18 (×2): qty 2

## 2021-10-18 MED ORDER — ENOXAPARIN SODIUM 40 MG/0.4ML IJ SOSY
40.0000 mg | PREFILLED_SYRINGE | INTRAMUSCULAR | Status: DC
  Administered 2021-10-19 – 2021-10-20 (×2): 40 mg via SUBCUTANEOUS
  Filled 2021-10-18 (×2): qty 0.4

## 2021-10-18 MED ORDER — SOD CITRATE-CITRIC ACID 500-334 MG/5ML PO SOLN
ORAL | Status: AC
  Filled 2021-10-18: qty 30

## 2021-10-18 MED ORDER — DIPHENHYDRAMINE HCL 25 MG PO CAPS: 25.0000 mg | ORAL_CAPSULE | ORAL | Status: DC | PRN

## 2021-10-18 MED ORDER — FENTANYL CITRATE (PF) 100 MCG/2ML IJ SOLN
INTRAMUSCULAR | Status: AC
  Filled 2021-10-18: qty 2

## 2021-10-18 MED ORDER — SOD CITRATE-CITRIC ACID 500-334 MG/5ML PO SOLN
30.0000 mL | ORAL | Status: AC
  Administered 2021-10-18: 30 mL via ORAL

## 2021-10-18 MED ORDER — DIPHENHYDRAMINE HCL 25 MG PO CAPS: 25.0000 mg | ORAL_CAPSULE | Freq: Four times a day (QID) | ORAL | Status: DC | PRN

## 2021-10-18 MED ORDER — SIMETHICONE 80 MG PO CHEW
80.0000 mg | CHEWABLE_TABLET | Freq: Three times a day (TID) | ORAL | Status: DC
  Administered 2021-10-19 – 2021-10-20 (×3): 80 mg via ORAL
  Filled 2021-10-18 (×4): qty 1

## 2021-10-18 MED ORDER — FENTANYL CITRATE (PF) 100 MCG/2ML IJ SOLN
INTRAMUSCULAR | Status: DC | PRN
  Administered 2021-10-18: 15 ug via INTRATHECAL

## 2021-10-18 MED ORDER — MORPHINE SULFATE (PF) 0.5 MG/ML IJ SOLN
INTRAMUSCULAR | Status: AC
  Filled 2021-10-18: qty 10

## 2021-10-18 MED ORDER — LACTATED RINGERS IV BOLUS
500.0000 mL | Freq: Once | INTRAVENOUS | Status: AC
  Administered 2021-10-18: 500 mL via INTRAVENOUS

## 2021-10-18 MED ORDER — OXYTOCIN-SODIUM CHLORIDE 30-0.9 UT/500ML-% IV SOLN
INTRAVENOUS | Status: DC | PRN
  Administered 2021-10-18: 300 mL via INTRAVENOUS

## 2021-10-18 MED ORDER — PROMETHAZINE HCL 25 MG/ML IJ SOLN: 6.2500 mg | INTRAMUSCULAR | Status: DC | PRN

## 2021-10-18 MED ORDER — ONDANSETRON HCL 4 MG/2ML IJ SOLN
INTRAMUSCULAR | Status: DC | PRN
  Administered 2021-10-18: 4 mg via INTRAVENOUS

## 2021-10-18 MED ORDER — CEFAZOLIN SODIUM-DEXTROSE 2-4 GM/100ML-% IV SOLN
INTRAVENOUS | Status: AC
  Filled 2021-10-18: qty 100

## 2021-10-18 MED ORDER — SODIUM CHLORIDE 0.9 % IR SOLN
Status: DC | PRN
  Administered 2021-10-18: 1

## 2021-10-18 MED ORDER — ACETAMINOPHEN 160 MG/5ML PO SOLN: 960.0000 mg | Freq: Once | ORAL | Status: AC

## 2021-10-18 MED ORDER — ACETAMINOPHEN 500 MG PO TABS
1000.0000 mg | ORAL_TABLET | Freq: Once | ORAL | Status: AC
  Administered 2021-10-18: 1000 mg via ORAL

## 2021-10-18 MED ORDER — NALOXONE HCL 0.4 MG/ML IJ SOLN: 0.4000 mg | INTRAMUSCULAR | Status: DC | PRN

## 2021-10-18 MED ORDER — SIMETHICONE 80 MG PO CHEW
80.0000 mg | CHEWABLE_TABLET | ORAL | Status: DC | PRN
  Administered 2021-10-19: 80 mg via ORAL

## 2021-10-18 MED ORDER — OXYCODONE HCL 5 MG/5ML PO SOLN: 5.0000 mg | Freq: Once | ORAL | Status: DC | PRN

## 2021-10-18 MED ORDER — ACETAMINOPHEN 500 MG PO TABS
1000.0000 mg | ORAL_TABLET | Freq: Four times a day (QID) | ORAL | Status: DC
  Administered 2021-10-18 – 2021-10-20 (×8): 1000 mg via ORAL
  Filled 2021-10-18 (×8): qty 2

## 2021-10-18 MED ORDER — MEPERIDINE HCL 25 MG/ML IJ SOLN: 6.2500 mg | INTRAMUSCULAR | Status: DC | PRN

## 2021-10-18 MED ORDER — FAMOTIDINE 20 MG PO TABS
20.0000 mg | ORAL_TABLET | Freq: Once | ORAL | Status: AC
  Administered 2021-10-18: 20 mg via ORAL

## 2021-10-18 MED ORDER — SCOPOLAMINE 1 MG/3DAYS TD PT72
1.0000 | MEDICATED_PATCH | Freq: Once | TRANSDERMAL | Status: DC
  Administered 2021-10-18: 1.5 mg via TRANSDERMAL

## 2021-10-18 MED ORDER — TETANUS-DIPHTH-ACELL PERTUSSIS 5-2.5-18.5 LF-MCG/0.5 IM SUSY: 0.5000 mL | PREFILLED_SYRINGE | Freq: Once | INTRAMUSCULAR | Status: DC

## 2021-10-18 MED ORDER — OXYCODONE HCL 5 MG PO TABS
5.0000 mg | ORAL_TABLET | ORAL | Status: DC | PRN
  Administered 2021-10-19: 5 mg via ORAL
  Filled 2021-10-18: qty 1

## 2021-10-18 MED ORDER — COCONUT OIL OIL: 1.0000 | TOPICAL_OIL | Status: DC | PRN

## 2021-10-18 MED ORDER — OXYTOCIN-SODIUM CHLORIDE 30-0.9 UT/500ML-% IV SOLN
INTRAVENOUS | Status: AC
  Filled 2021-10-18: qty 500

## 2021-10-18 MED ORDER — SODIUM CHLORIDE 0.9% FLUSH: 3.0000 mL | INTRAVENOUS | Status: DC | PRN

## 2021-10-18 MED ORDER — OXYTOCIN-SODIUM CHLORIDE 30-0.9 UT/500ML-% IV SOLN
2.5000 [IU]/h | INTRAVENOUS | Status: AC
  Administered 2021-10-18: 2.5 [IU]/h via INTRAVENOUS
  Filled 2021-10-18: qty 500

## 2021-10-18 MED ORDER — ONDANSETRON HCL 4 MG/2ML IJ SOLN: 4.0000 mg | Freq: Three times a day (TID) | INTRAMUSCULAR | Status: DC | PRN

## 2021-10-18 MED ORDER — STERILE WATER FOR IRRIGATION IR SOLN
Status: DC | PRN
  Administered 2021-10-18: 1000 mL

## 2021-10-18 MED ORDER — CHLORHEXIDINE GLUCONATE 0.12 % MT SOLN
OROMUCOSAL | Status: AC
  Filled 2021-10-18: qty 15

## 2021-10-18 MED ORDER — ACETAMINOPHEN 500 MG PO TABS
ORAL_TABLET | ORAL | Status: AC
  Filled 2021-10-18: qty 2

## 2021-10-18 MED ORDER — MENTHOL 3 MG MT LOZG: 1.0000 | LOZENGE | OROMUCOSAL | Status: DC | PRN

## 2021-10-18 MED ORDER — PHENYLEPHRINE HCL-NACL 20-0.9 MG/250ML-% IV SOLN
INTRAVENOUS | Status: DC | PRN
  Administered 2021-10-18: 60 ug/min via INTRAVENOUS

## 2021-10-18 MED ORDER — FAMOTIDINE 20 MG PO TABS
ORAL_TABLET | ORAL | Status: AC
  Filled 2021-10-18: qty 1

## 2021-10-18 SURGICAL SUPPLY — 31 items
BENZOIN TINCTURE PRP APPL 2/3 (GAUZE/BANDAGES/DRESSINGS) ×1 IMPLANT
CANISTER SUCT 3000ML PPV (MISCELLANEOUS) ×1 IMPLANT
CHLORAPREP W/TINT 26ML (MISCELLANEOUS) ×2 IMPLANT
CLAMP UMBILICAL CORD (MISCELLANEOUS) ×1 IMPLANT
CLIP FILSHIE TUBAL LIGA STRL (Clip) ×1 IMPLANT
CLOTH BEACON ORANGE TIMEOUT ST (SAFETY) ×1 IMPLANT
DRAPE SHEET LG 3/4 BI-LAMINATE (DRAPES) ×1 IMPLANT
DRSG OPSITE POSTOP 4X10 (GAUZE/BANDAGES/DRESSINGS) ×1 IMPLANT
ELECT REM PT RETURN 9FT ADLT (ELECTROSURGICAL) ×1
ELECTRODE REM PT RTRN 9FT ADLT (ELECTROSURGICAL) ×1 IMPLANT
GAUZE SPONGE 4X4 12PLY STRL LF (GAUZE/BANDAGES/DRESSINGS) IMPLANT
GLOVE BIOGEL PI IND STRL 7.0 (GLOVE) ×3 IMPLANT
GLOVE ECLIPSE 6.5 STRL STRAW (GLOVE) ×1 IMPLANT
GOWN STRL REUS W/ TWL LRG LVL3 (GOWN DISPOSABLE) ×2 IMPLANT
GOWN STRL REUS W/TWL LRG LVL3 (GOWN DISPOSABLE) ×2
HEMOSTAT ARISTA ABSORB 3G PWDR (HEMOSTASIS) IMPLANT
NS IRRIG 1000ML POUR BTL (IV SOLUTION) ×1 IMPLANT
PAD ABD 7.5X8 STRL (GAUZE/BANDAGES/DRESSINGS) IMPLANT
PAD OB MATERNITY 4.3X12.25 (PERSONAL CARE ITEMS) ×1 IMPLANT
PAD PREP 24X48 CUFFED NSTRL (MISCELLANEOUS) ×1 IMPLANT
RETRACTOR WND ALEXIS 25 LRG (MISCELLANEOUS) IMPLANT
RTRCTR WOUND ALEXIS 25CM LRG (MISCELLANEOUS)
STRIP CLOSURE SKIN 1/2X4 (GAUZE/BANDAGES/DRESSINGS) ×1 IMPLANT
SUT PLAIN 2 0 XLH (SUTURE) ×1 IMPLANT
SUT VIC AB 0 CT1 36 (SUTURE) ×2 IMPLANT
SUT VIC AB 2-0 CT1 27 (SUTURE) ×1
SUT VIC AB 2-0 CT1 TAPERPNT 27 (SUTURE) ×1 IMPLANT
SUT VIC AB 4-0 KS 27 (SUTURE) ×1 IMPLANT
TOWEL OR 17X24 6PK STRL BLUE (TOWEL DISPOSABLE) ×3 IMPLANT
TRAY FOLEY CATH SILVER 16FR (SET/KITS/TRAYS/PACK) ×1 IMPLANT
WATER STERILE IRR 1000ML POUR (IV SOLUTION) ×1 IMPLANT

## 2021-10-18 NOTE — Op Note (Signed)
Herminio Heads PROCEDURE DATE: 10/18/2021  PREOPERATIVE DIAGNOSES: Intrauterine pregnancy at 6w0dweeks gestation;  elective rLTCS, unwanted fertility  POSTOPERATIVE DIAGNOSES: The same  PROCEDURE: Repeat Low Transverse Cesarean Section w/ bilateral salpingectomy   SURGEON:  Dr. NErnestina Patches ASSISTANT:  Dr. AJanus Molder An experienced assistant was required given the standard of surgical care given the complexity of the case.  This assistant was needed for exposure, dissection, suctioning, retraction, instrument exchange, assisting with delivery with administration of fundal pressure, and for overall help during the procedure.  ANESTHESIOLOGY TEAM: Anesthesiologist: BAudry Pili MD CRNA: MIgnacia Bayley CRNA  INDICATIONS: Brittany Knapp a 41y.o. [phone] 331w0dere for cesarean section secondary to the indications listed under preoperative diagnoses; please see preoperative note for further details.  The risks of surgery were discussed with the patient including but were not limited to: bleeding which may require transfusion or reoperation; infection which may require antibiotics; injury to bowel, bladder, ureters or other surrounding organs; injury to the fetus; need for additional procedures including hysterectomy in the event of a life-threatening hemorrhage; formation of adhesions; placental abnormalities wth subsequent pregnancies; incisional problems; thromboembolic phenomenon and other postoperative/anesthesia complications.  The patient concurred with the proposed plan, giving informed written consent for the procedure.    FINDINGS:  Viable female infant in cephalic presentation.  Apgars 9 and 9.  Clear amniotic fluid.  Intact placenta, three vessel cord.  Normal uterus, fallopian tubes and ovaries bilaterally.  ANESTHESIA: Spinal INTRAVENOUS FLUIDS: 3100 ml   ESTIMATED BLOOD LOSS: 649 ml URINE OUTPUT:  125 ml SPECIMENS: Placenta sent to L&D COMPLICATIONS: None immediate  PROCEDURE  IN DETAIL: The patient preoperatively received intravenous antibiotics and had sequential compression devices applied to her lower extremities. She was then taken to the operating room where spinal anesthesia was administered and was found to be adequate. She was then placed in a dorsal supine position with a leftward tilt, and prepped and draped in a sterile manner.  A foley catheter was placed into her bladder and attached to constant gravity.  After an adequate timeout was performed, a Pfannenstiel skin incision was made with scalpel and carried through to the underlying layer of fascia. The fascia was incised in the midline, and this incision was extended bilaterally in a blunt fashion.  The underlying rectus muscles were dissected off the fascia superiorly and inferiorly in a blunt fashion. The rectus muscles were separated in the midline and the peritoneum was entered bluntly. The Alexis self-retaining retractor was introduced into the abdominal cavity.  Attention was turned to the lower uterine segment where a low transverse hysterotomy was made with a scalpel and extended bilaterally bluntly.  The infant was successfully delivered through a loose nuchal, the cord was clamped and cut after one minute, and the infant was handed over to the awaiting neonatology team. Uterine massage was then administered, and the placenta delivered intact with a three-vessel cord. The uterus was then cleared of clots and debris.  The hysterotomy was closed with 0 Vicryl in a running unlocked fashion, and intermittently locked for hemostasis. The pelvis was cleared of all clot and debris. Arista used over hysterotomy.    Attention was then turned to the fallopian tubes. Bilateral salpingectomy: A Kelly clamp was placed across the left fallopian tube taking care to incorporate the fimbriae. A second clamp was then placed below the first. The fallopian tube was then removed with Metzenbaum scissors. The pedicle was then ligated  with 2-0 plain gut suture  and the second clamp was removed with excellent hemostasis noted. Then a second ligature of 2-0 plain gut suture was placed below the remaining clamp, the clamp was then removed and again excellent hemostasis was observed. The same procedure was then carried out on the right fallopian tube with excellent hemostasis noted.  Hemostasis was confirmed on all surfaces.   The retractor was removed.  The peritoneum was closed with a 0 Vicryl running stitch. The fascia was then closed using 0 Vicryl.  The subcutaneous layer was irrigated, re-approximated with 2-0 plain gut interrupted stitches, and the skin was closed with a 4-0 Vicryl subcuticular stitch. The patient tolerated the procedure well. Sponge, instrument and needle counts were correct x 3.  She was taken to the recovery room in stable condition.   Brittany Fee, DO OB Fellow, [place] [facility] [place] 10/18/2021, 2:17 PM

## 2021-10-18 NOTE — Anesthesia Procedure Notes (Signed)
Spinal  Patient location during procedure: OR Start time: 10/18/2021 12:38 PM End time: 10/18/2021 12:41 PM Reason for block: surgical anesthesia Staffing Performed: anesthesiologist  Anesthesiologist: Audry Pili, MD Performed by: Audry Pili, MD Authorized by: Audry Pili, MD   Preanesthetic Checklist Completed: patient identified, IV checked, risks and benefits discussed, surgical consent, monitors and equipment checked, pre-op evaluation and timeout performed Spinal Block Patient position: sitting Prep: DuraPrep Patient monitoring: heart rate, cardiac monitor, continuous pulse ox and blood pressure Approach: midline Location: L3-4 Injection technique: single-shot Needle Needle type: Pencan  Needle gauge: 24 G Additional Notes Consent was obtained prior to the procedure with all questions answered and concerns addressed. Risks including, but not limited to, bleeding, infection, nerve damage, paralysis, failed block, inadequate analgesia, allergic reaction, high spinal, itching, and headache were discussed and the patient wished to proceed. Functioning IV was confirmed and monitors were applied. Sterile prep and drape, including hand hygiene, mask, and sterile gloves were used. The patient was positioned and the spine was prepped. The skin was anesthetized with lidocaine. Free flow of clear CSF was obtained prior to injecting local anesthetic into the CSF. The spinal needle aspirated freely following injection. The needle was carefully withdrawn. The patient tolerated the procedure well.   Renold Don, MD

## 2021-10-18 NOTE — Anesthesia Preprocedure Evaluation (Addendum)
Anesthesia Evaluation  Patient identified by MRN, date of birth, ID band Patient awake    Reviewed: Allergy & Precautions, NPO status , Patient's Chart, lab work & pertinent test results  History of Anesthesia Complications Negative for: history of anesthetic complications  Airway Mallampati: II  TM Distance: >3 FB Neck ROM: Full    Dental   Pulmonary neg pulmonary ROS,    Pulmonary exam normal        Cardiovascular hypertension (hx PIH previous pregnancy), Normal cardiovascular exam     Neuro/Psych negative neurological ROS  negative psych ROS   GI/Hepatic negative GI ROS, Neg liver ROS,   Endo/Other  diabetes, Gestational, Oral Hypoglycemic Agents Obesity   Renal/GU negative Renal ROS     Musculoskeletal negative musculoskeletal ROS (+)   Abdominal   Peds  Hematology negative hematology ROS (+)   Anesthesia Other Findings   Reproductive/Obstetrics (+) Pregnancy                            Anesthesia Physical Anesthesia Plan  ASA: 2  Anesthesia Plan: Spinal   Post-op Pain Management: Tylenol PO (pre-op)* and Regional block*   Induction:   PONV Risk Score and Plan: 2 and Treatment may vary due to age or medical condition, Ondansetron and Scopolamine patch - Pre-op  Airway Management Planned: Natural Airway  Additional Equipment: None  Intra-op Plan:   Post-operative Plan:   Informed Consent: I have reviewed the patients History and Physical, chart, labs and discussed the procedure including the risks, benefits and alternatives for the proposed anesthesia with the patient or authorized representative who has indicated his/her understanding and acceptance.       Plan Discussed with: CRNA and Anesthesiologist  Anesthesia Plan Comments: (Labs reviewed, platelets acceptable. Discussed risks and benefits of spinal, including spinal/epidural hematoma, infection, failed block,  and PDPH. Patient expressed understanding and wished to proceed. )       Anesthesia Quick Evaluation

## 2021-10-18 NOTE — Transfer of Care (Signed)
Immediate Anesthesia Transfer of Care Note  Patient: Brittany Knapp  Procedure(s) Performed: CESAREAN SECTION WITH BILATERAL TUBAL LIGATION (Bilateral)  Patient Location: PACU  Anesthesia Type:Spinal  Level of Consciousness: awake  Airway & Oxygen Therapy: Patient Spontanous Breathing  Post-op Assessment: Report given to RN and Post -op Vital signs reviewed and stable  Post vital signs: Reviewed and stable  Last Vitals:  Vitals Value Taken Time  BP 98/54 10/18/21 1415  Temp    Pulse 64 10/18/21 1416  Resp 19 10/18/21 1416  SpO2 96 % 10/18/21 1416  Vitals shown include unvalidated device data.  Last Pain:  Vitals:   10/18/21 1111  TempSrc: Oral  PainSc:          Complications: No notable events documented.

## 2021-10-18 NOTE — Discharge Summary (Signed)
Postpartum Discharge Summary     Patient Name: Brittany Knapp DOB: 03-Nov-1980 MRN: [id number]  Date of admission: 10/18/2021 Delivery date:10/18/2021  Delivering provider: Caren Macadam  Date of discharge: 10/20/2021  Admitting diagnosis: RCS Undesired Fertility Intrauterine pregnancy: [redacted]w[redacted]d     Secondary diagnosis:  Active Problems:   Supervision of high risk pregnancy, antepartum   Prediabetes   Previous cesarean delivery affecting pregnancy, antepartum   History of IUFD at 14 weeks   GDM (gestational diabetes mellitus)   History of VBAC   S/P repeat low transverse C-section  Additional problems: None    Discharge diagnosis: Term Pregnancy Delivered and GDM A2                                              Post partum procedures: None Augmentation: N/A Complications: None  Hospital course: Sceduled C/S   41 y.o. yo [id number] at [redacted]w[redacted]d was admitted to the hospital 10/18/2021 for scheduled cesarean section with the following indication:Elective Repeat.Delivery details are as follows:  Membrane Rupture Time/Date: 1:07 PM ,10/18/2021   Delivery Method:C-Section, Low Transverse  Details of operation can be found in separate operative note.  Patient had an uncomplicated postpartum course.  She is ambulating, tolerating a regular diet, passing flatus, and urinating well. Patient is discharged home in stable condition on  10/20/21        Newborn Data: Birth date:10/18/2021  Birth time:1:08 PM  Gender:Female  Living status:Living  Apgars:9 ,9  Weight:3430 g     Magnesium Sulfate received: No BMZ received: No Rhophylac:No MMR:No T-DaP:Given prenatally Flu: No Transfusion:No  Physical exam  Vitals:   10/19/21 1050 10/19/21 1519 10/19/21 2112 10/20/21 0515  BP: (!) 103/59 114/64 119/79 122/68  Pulse: 76 79 75 82  Resp: $Remo'16 18 19 16  'XsLiU$ Temp: 98 F (36.7 C) 98 F (36.7 C) 97.8 F (36.6 C) 98 F (36.7 C)  TempSrc: Oral Oral Oral Oral  SpO2: 96% 95% 97% 100%   General:  alert, cooperative, and no distress Lochia: appropriate Uterine Fundus: firm Incision: Healing well with no significant drainage, No significant erythema, Dressing is clean, dry, and intact DVT Evaluation: No evidence of DVT seen on physical exam. Labs: Lab Results  Component Value Date   WBC 12.9 (H) 10/19/2021   HGB 9.7 (L) 10/19/2021   HCT 28.7 (L) 10/19/2021   MCV 77.6 (L) 10/19/2021   PLT 122 (L) 10/19/2021      Latest Ref Rng & Units 04/05/2021    2:56 PM  CMP  Glucose 70 - 99 mg/dL 125   BUN 6 - 20 mg/dL 11   Creatinine 0.57 - 1.00 mg/dL 0.68   Sodium 134 - 144 mmol/L 135   Potassium 3.5 - 5.2 mmol/L 3.7   Chloride 96 - 106 mmol/L 97   CO2 20 - 29 mmol/L 22   Calcium 8.7 - 10.2 mg/dL 9.6   Total Protein 6.0 - 8.5 g/dL 7.3   Total Bilirubin 0.0 - 1.2 mg/dL 0.5   Alkaline Phos 44 - 121 IU/L 72   AST 0 - 40 IU/L 16   ALT 0 - 32 IU/L 12    Edinburgh Score:    10/19/2021   12:40 PM  Edinburgh Postnatal Depression Scale Screening Tool  I have been able to laugh and see the funny side of things. 0  I have  looked forward with enjoyment to things. 0  I have blamed myself unnecessarily when things went wrong. 0  I have been anxious or worried for no good reason. 0  I have felt scared or panicky for no good reason. 0  Things have been getting on top of me. 0  I have been so unhappy that I have had difficulty sleeping. 0  I have felt sad or miserable. 0  I have been so unhappy that I have been crying. 0  The thought of harming myself has occurred to me. 0  Edinburgh Postnatal Depression Scale Total 0     After visit meds:  Allergies as of 10/20/2021   No Known Allergies      Medication List     STOP taking these medications    aspirin EC 81 MG tablet   metFORMIN 500 MG tablet Commonly known as: GLUCOPHAGE       TAKE these medications    multivitamin-prenatal 27-0.8 MG Tabs tablet Take 1 tablet by mouth daily at 12 noon.   oxyCODONE 5 MG immediate  release tablet Commonly known as: Oxy IR/ROXICODONE Take 1 tablet (5 mg total) by mouth every 4 (four) hours as needed for severe pain or breakthrough pain.   pantoprazole 40 MG tablet Commonly known as: PROTONIX Take 1 tablet by mouth once daily         Discharge home in stable condition Infant Feeding: Breast Infant Disposition:home with mother Discharge instruction: per After Visit Summary and Postpartum booklet. Activity: Advance as tolerated. Pelvic rest for 6 weeks.  Diet: routine diet Future Appointments: Future Appointments  Date Time Provider [place]  10/25/2021  1:30 PM [facility] NURSE [facility]  11/30/2021  8:35 AM Aletha Halim, MD [facility]  11/30/2021  9:30 AM [facility]   Follow up Visit:  Message sent to [facility] by Autry-Lott on 10/20/2021  Please schedule this patient for a In person postpartum visit in 6 weeks with the following provider: MD and APP. Additional Postpartum F/U:2 hour GTT and Incision check 1 week  High risk pregnancy complicated by: GDM Delivery mode:  C-Section, Low Transverse  Anticipated Birth Control:   PP done with C/S   10/20/2021 Concepcion Living, MD

## 2021-10-18 NOTE — Anesthesia Postprocedure Evaluation (Signed)
Anesthesia Post Note  Patient: Brittany Knapp  Procedure(s) Performed: CESAREAN SECTION WITH BILATERAL TUBAL LIGATION (Bilateral)     Patient location during evaluation: PACU Anesthesia Type: Spinal Level of consciousness: awake and alert Pain management: pain level controlled Vital Signs Assessment: post-procedure vital signs reviewed and stable Respiratory status: spontaneous breathing and respiratory function stable Cardiovascular status: blood pressure returned to baseline and stable Postop Assessment: spinal receding and no apparent nausea or vomiting Anesthetic complications: no   No notable events documented.  Last Vitals:  Vitals:   10/18/21 1430 10/18/21 1445  BP: (!) 94/56 (!) 88/51  Pulse:    Resp:    Temp:    SpO2:      Last Pain:  Vitals:   10/18/21 1445  TempSrc:   PainSc: 0-No pain   Pain Goal:    LLE Motor Response: Purposeful movement (10/18/21 1445) LLE Sensation: Tingling (10/18/21 1445) RLE Motor Response: Purposeful movement (10/18/21 1445) RLE Sensation: Tingling (10/18/21 1445)     Epidural/Spinal Function Cutaneous sensation: Tingles (10/18/21 1445), Patient able to flex knees: No (10/18/21 1445), Patient able to lift hips off bed: No (10/18/21 1445), Back pain beyond tenderness at insertion site: No (10/18/21 1445), Progressively worsening motor and/or sensory loss: No (10/18/21 1445), Bowel and/or bladder incontinence post epidural: No (10/18/21 1445)  Audry Pili

## 2021-10-18 NOTE — H&P (Signed)
OBSTETRIC ADMISSION HISTORY AND PHYSICAL  Brittany Knapp is a 41 y.o. female [phone] with IUP at 80w0dby LMP presenting for scheduled c-section. She reports +FMs, No LOF, no VB, no blurry vision, headaches or peripheral edema, and RUQ pain.  She plans on breast and formula feeding. She request IUD v. Tubal for birth control. She received her prenatal care at  [facility] --->  Estimated Date of Delivery: 10/25/21  Sono:    '@[redacted]w[redacted]d'$ , CWD, normal anatomy, variable presentation, anterior placental lie, 238g, 12% EFW  '@[redacted]w[redacted]d'$ , cephalic presentation, [id number] 47% EFW   Prenatal History/Complications:  -AMA -GDM -Hx of VBAC (consent signed 7/12) -Hx of IUFD '@31'$  wks    Past Medical History: Past Medical History:  Diagnosis Date   Medical history non-contributory    Pregnancy induced hypertension 1999    Past Surgical History: Past Surgical History:  Procedure Laterality Date   CESAREAN SECTION      Obstetrical History: OB History     Gravida  4   Para  3   Term  2   Preterm  1   AB      Living  2      SAB      IAB      Ectopic      Multiple      Live Births  2           Social History Social History   Socioeconomic History   Marital status: Married    Spouse name: Not on file   Number of children: Not on file   Years of education: Not on file   Highest education level: Not on file  Occupational History    Comment: cleans houses  Tobacco Use   Smoking status: Never   Smokeless tobacco: Never  Vaping Use   Vaping Use: Never used  Substance and Sexual Activity   Alcohol use: Never   Drug use: Never   Sexual activity: Yes    Birth control/protection: None  Other Topics Concern   Not on file  Social History Narrative   Not on file   Social Determinants of Health   Financial Resource Strain: Not on file  Food Insecurity: No Food Insecurity (09/21/2021)   Hunger Vital Sign    Worried About Running Out of Food in the Last Year: Never  true    Ran Out of Food in the Last Year: Never true  Recent Concern: Food Insecurity - Food Insecurity Present (08/23/2021)   Hunger Vital Sign    Worried About Running Out of Food in the Last Year: Sometimes true    Ran Out of Food in the Last Year: Never true  Transportation Needs: No Transportation Needs (09/21/2021)   PRAPARE - THydrologist(Medical): No    Lack of Transportation (Non-Medical): No  Physical Activity: Not on file  Stress: Not on file  Social Connections: Not on file    Family History: Family History  Problem Relation Age of Onset   Hypertension Mother    Diabetes Father    Asthma Sister    Diabetes Sister    Depression Sister    Diabetes Brother    Depression Brother    Birth defects Neg Hx    Cancer Neg Hx    Heart disease Neg Hx    Stroke Neg Hx     Allergies: No Known Allergies  No medications prior to admission.  Review of Systems   All systems reviewed and negative except as stated in HPI  Last menstrual period 01/18/2021, unknown if currently breastfeeding. General appearance: alert, cooperative, and appears stated age Lungs: clear to auscultation bilaterally Heart: regular rate and rhythm Abdomen: soft, non-tender; bowel sounds normal Pelvic: NA Extremities: Homans sign is negative, no sign of DVT Presentation: cephalic FHR 852     Prenatal labs: ABO, Rh: --/--/A POS (09/19 0929) Antibody: NEG (09/19 0929) Rubella: 12.00 (03/09 1456) RPR: NON REACTIVE (09/19 0924)  HBsAg: Negative (03/09 1456)  HIV: NON REACTIVE (09/19 0924)  GBS: Negative/-- (09/07 1300)  1 hr Glucola 218 Genetic screening  Neg, LR Anatomy US wnl  Prenatal Transfer Tool  Maternal Diabetes: Yes:  Diabetes Type:  Insulin/Medication controlled Genetic Screening: Normal Maternal Ultrasounds/Referrals: Normal Fetal Ultrasounds or other Referrals:  None Maternal Substance Abuse:  No Significant Maternal Medications:   None Significant Maternal Lab Results:  Group B Strep negative Number of Prenatal Visits:greater than 3 verified prenatal visits Other Comments:  None  No results found for this or any previous visit (from the past 24 hour(s)).  Patient Active Problem List   Diagnosis Date Noted   History of VBAC 08/08/2021   History of IUFD at 78 weeks 07/28/2021   GDM (gestational diabetes mellitus) 07/28/2021   Previous cesarean delivery affecting pregnancy, antepartum 04/24/2021   Prediabetes 04/06/2021   Supervision of high risk pregnancy, antepartum 04/05/2021   AMA (advanced maternal age) multigravida 35+, first trimester 04/05/2021    Assessment/Plan:  Brittany Knapp is a 41 y.o. [id number] at 11w0dhere for a scheduled for rLTCS and BTL.   The risks of surgery were discussed with the patient including but were not limited to: bleeding which may require transfusion or reoperation; infection which may require antibiotics; injury to bowel, bladder, ureters or other surrounding organs; injury to the fetus; need for additional procedures including hysterectomy in the event of a life-threatening hemorrhage; formation of adhesions; placental abnormalities wth subsequent pregnancies; incisional problems; thromboembolic phenomenon and other postoperative/anesthesia complications.   The patient concurred with the proposed plan, giving informed written consent for the procedures.  Antibiotics ordered.  To OR when ready.   Starting Hgb 12.6  #MOF: Breast #MOC: BTL, signed consent today for procedure.  #Circ: n/a, female  #A2GDM: check fasting BG POD#1. 2 hr GTT postpartum  Brittany Autry-Lott, DO  10/18/2021, 8:45 AM

## 2021-10-19 LAB — CBC
HCT: 28.7 % — ABNORMAL LOW (ref 36.0–46.0)
Hemoglobin: 9.7 g/dL — ABNORMAL LOW (ref 12.0–15.0)
MCH: 26.2 pg (ref 26.0–34.0)
MCHC: 33.8 g/dL (ref 30.0–36.0)
MCV: 77.6 fL — ABNORMAL LOW (ref 80.0–100.0)
Platelets: 122 10*3/uL — ABNORMAL LOW (ref 150–400)
RBC: 3.7 MIL/uL — ABNORMAL LOW (ref 3.87–5.11)
RDW: 14.9 % (ref 11.5–15.5)
WBC: 12.9 10*3/uL — ABNORMAL HIGH (ref 4.0–10.5)
nRBC: 0 % (ref 0.0–0.2)

## 2021-10-19 LAB — GLUCOSE, CAPILLARY: Glucose-Capillary: 83 mg/dL (ref 70–99)

## 2021-10-19 LAB — BIRTH TISSUE RECOVERY COLLECTION (PLACENTA DONATION)

## 2021-10-19 MED ORDER — SODIUM CHLORIDE 0.9 % IV SOLN
500.0000 mg | Freq: Once | INTRAVENOUS | Status: AC
  Administered 2021-10-19: 500 mg via INTRAVENOUS
  Filled 2021-10-19: qty 500

## 2021-10-19 NOTE — Progress Notes (Signed)
POSTPARTUM PROGRESS NOTE  POD #1  Subjective:  Brittany Knapp is a 41 y.o. [phone] s/p rLTCS with BTL at [redacted]w[redacted]d No acute events overnight. She reports she is doing well. She denies any problems with ambulating, voiding or po intake. Denies nausea or vomiting. She has passed flatus. Pain is well controlled.  Lochia is adequate.  Objective: Blood pressure 105/61, pulse 84, temperature 98.5 F (36.9 C), temperature source Oral, resp. rate 18, last menstrual period 01/18/2021, SpO2 96 %, unknown if currently breastfeeding.  Physical Exam:  General: alert, cooperative and no distress Chest: no respiratory distress Heart: regular rate, distal pulses intact Uterine Fundus: firm, appropriately tender DVT Evaluation: No calf swelling or tenderness Extremities: mild edema Skin: warm, dry; incision clean/dry/intact w/ honeycomb dressing in place  Recent Labs    10/16/21 0924 10/19/21 0527  HGB 12.6 9.7*  HCT 37.9 28.7*    Assessment/Plan: Brittany Xieis a 41y.o. [id number]/p rLTCS with BTL at 370w0dor repeat elective.  POD#1 - Doing welll; pain well controlled. H/H appropriate  Routine postpartum care  OOB, ambulated  Lovenox for VTE prophylaxis Anemia: asymptomatic  IV Venofer today Contraception: BTL done Feeding: breast/bottle  Dispo: Plan for discharge in the next 24-48h.   LOS: 1 day   Brittany PalDO OB Fellow  10/19/2021, 7:05 AM

## 2021-10-19 NOTE — Lactation Note (Signed)
This note was copied from a baby's chart. Lactation Consultation Note  Patient Name: Girl Hoang Reich NIOEV'O Date: 10/19/2021 Reason for consult: Initial assessment;Term Age:40 hours  P3: Term infant at 39+0 weeks Feeding preference: Breast/formula Weight loss: 6%  RN in room formula feeding infant when I arrived.  Birth parent had finished breast feeding and desired supplementation.  Breast feeding basics reviewed.  Encouraged feeding 8-12 times/24 hours or sooner if "Annali" shows cues.  Birth parent is familiar with hand expression; suggested finger feeding/spoon feeding any colostrum she obtains to "Annali."  Offered to return as needed for latch assistance.  Birth parent has been feeling uterine contractions with breast feeding.  No support person present at this time.   Maternal Data Has patient been taught Hand Expression?: Yes Does the patient have breastfeeding experience prior to this delivery?: Yes How long did the patient breastfeed?: 8-12 months with her first two children  Feeding Mother's Current Feeding Choice: Breast Milk and Formula  LATCH Score                    Lactation Tools Discussed/Used    Interventions    Discharge    Consult Status Consult Status: Follow-up Date: 10/20/21 Follow-up type: In-patient    Milford Cilento R Chrislyn Seedorf 10/19/2021, 8:23 AM

## 2021-10-20 MED ORDER — OXYCODONE HCL 5 MG PO TABS: 5.0000 mg | ORAL_TABLET | ORAL | 0 refills | Status: DC | PRN

## 2021-10-22 LAB — SURGICAL PATHOLOGY

## 2021-10-25 ENCOUNTER — Ambulatory Visit (INDEPENDENT_AMBULATORY_CARE_PROVIDER_SITE_OTHER): Payer: Self-pay | Admitting: General Practice

## 2021-10-25 ENCOUNTER — Other Ambulatory Visit: Payer: Self-pay

## 2021-10-25 VITALS — BP 128/70 | HR 70 | Ht 59.0 in | Wt 157.0 lb

## 2021-10-25 DIAGNOSIS — Z5189 Encounter for other specified aftercare: Secondary | ICD-10-CM

## 2021-10-25 NOTE — Progress Notes (Signed)
Patient presents to office today for incision check following repeat c-section on 9/21. She reports doing well since then. Incision is clean, dry, & intact- appears to be healing well. She will follow up with [place] on 11/3 for pp visit.  Koren Bound RN BSN 10/25/21

## 2021-11-29 ENCOUNTER — Other Ambulatory Visit: Payer: Self-pay | Admitting: *Deleted

## 2021-11-29 DIAGNOSIS — O24429 Gestational diabetes mellitus in childbirth, unspecified control: Secondary | ICD-10-CM

## 2021-11-30 ENCOUNTER — Other Ambulatory Visit: Payer: Self-pay

## 2021-11-30 ENCOUNTER — Ambulatory Visit (INDEPENDENT_AMBULATORY_CARE_PROVIDER_SITE_OTHER): Payer: Self-pay | Admitting: Obstetrics and Gynecology

## 2021-11-30 DIAGNOSIS — O24415 Gestational diabetes mellitus in pregnancy, controlled by oral hypoglycemic drugs: Secondary | ICD-10-CM

## 2021-11-30 DIAGNOSIS — O24429 Gestational diabetes mellitus in childbirth, unspecified control: Secondary | ICD-10-CM

## 2021-11-30 DIAGNOSIS — O24435 Gestational diabetes mellitus in puerperium, controlled by oral hypoglycemic drugs: Secondary | ICD-10-CM

## 2021-11-30 NOTE — Progress Notes (Signed)
    Post Partum Visit Note  Brittany Knapp is a 41 y.o. [phone] s/p scheduled rpt c/s and bilateral salpingectomy on 9/21 at 39wks.  Anesthesia: spinal. Postpartum course has been going well. Baby is doing well. Baby is feeding by both breast and bottle - Similac Advance. Bleeding no bleeding. Bowel function is normal. Bladder function is normal. Patient is not sexually active. Contraception method is tubal ligation. Postpartum depression screening: negative.    Edinburgh Postnatal Depression Scale - 11/30/21 0835       Edinburgh Postnatal Depression Scale:  In the Past 7 Days   I have been able to laugh and see the funny side of things. 0    I have looked forward with enjoyment to things. 0    I have blamed myself unnecessarily when things went wrong. 0    I have been anxious or worried for no good reason. 0    I have felt scared or panicky for no good reason. 0    Things have been getting on top of me. 0    I have been so unhappy that I have had difficulty sleeping. 0    I have felt sad or miserable. 0    I have been so unhappy that I have been crying. 0    The thought of harming myself has occurred to me. 0    Edinburgh Postnatal Depression Scale Total 0            Review of Systems A comprehensive review of systems was negative.  Objective:  BP 111/76   Pulse (!) 120   Wt 153 lb 14.4 oz (69.8 kg)   Breastfeeding Yes   BMI 31.08 kg/m    General: NAD Abdomen: soft, nttp, nd. C/d/I incision  Assessment:   Normal PP visit  Plan:  *PP: routine care. Rpt pap in 3 years (2026) *GDMa2: PP GTT today. Recommend yearly A1c if normal today  RTC: 1 year and PRN  Aletha Halim, MD [facility]

## 2021-12-01 LAB — GLUCOSE TOLERANCE, 2 HOURS
Glucose, 2 hour: 138 mg/dL (ref 70–139)
Glucose, GTT - Fasting: 77 mg/dL (ref 70–99)

## 2021-12-02 ENCOUNTER — Encounter: Payer: Self-pay | Admitting: Obstetrics and Gynecology

## 2024-01-29 IMAGING — US US MFM OB FOLLOW-UP
1 series · 13 of 28 positions shown · non-contrast
Comparison: none

[Series 1: us mfm ob follow-up · 13 of 34 slices shown]
[im 2/34]
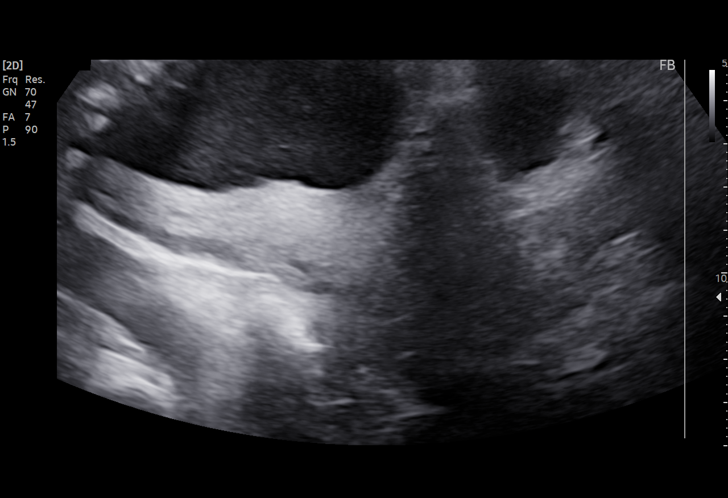
[im 4/34]
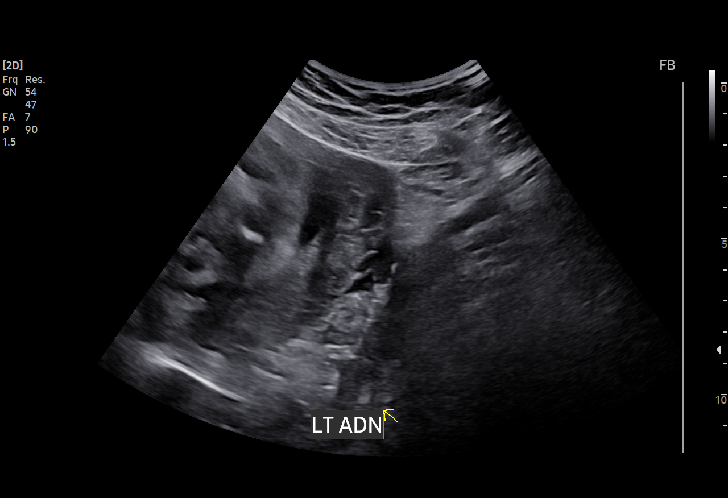
[im 7/34]
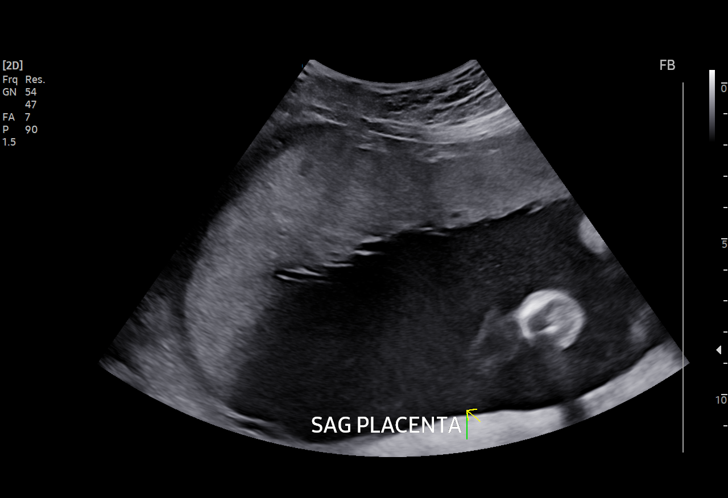
[im 9/34]
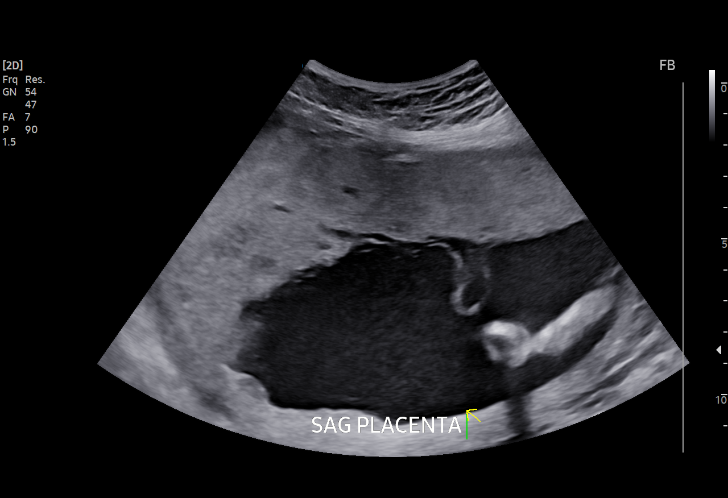
[im 12/34]
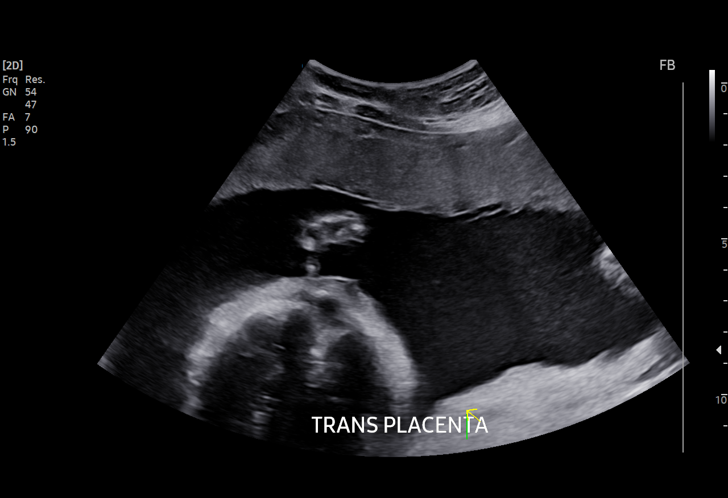
[im 14/34]
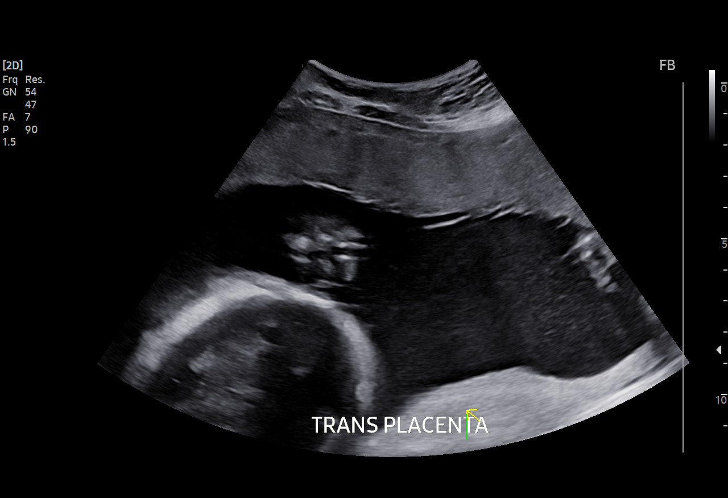
[im 18/34]
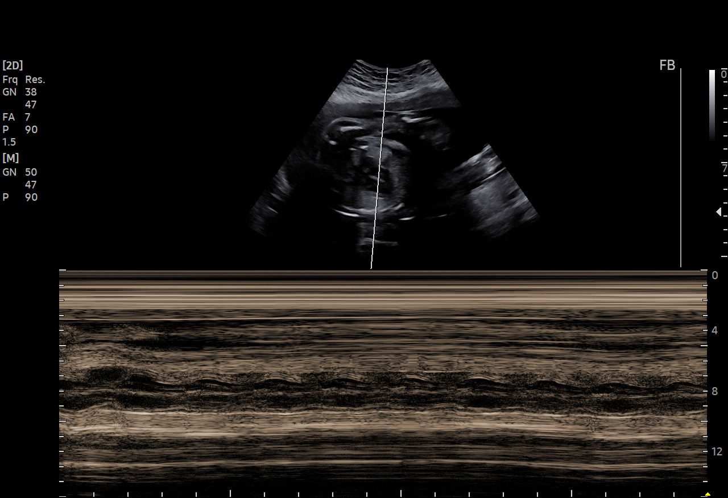
[im 20/34]
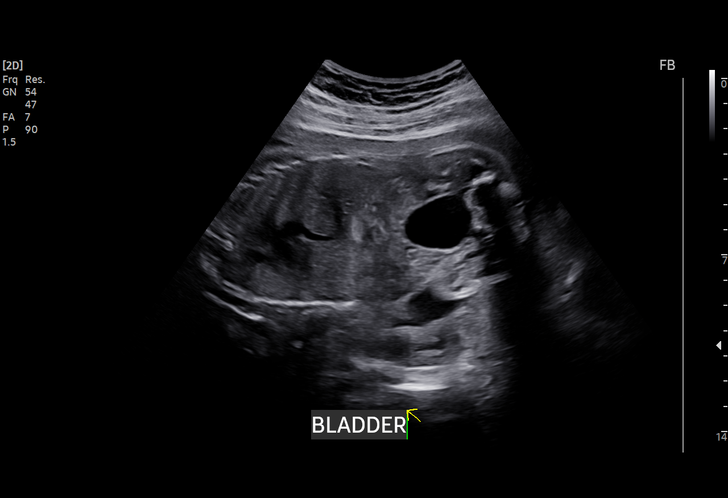
[im 23/34]
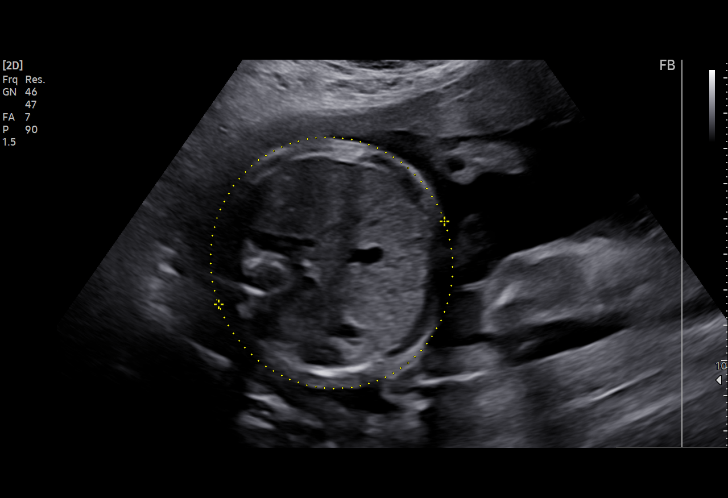
[im 25/34]
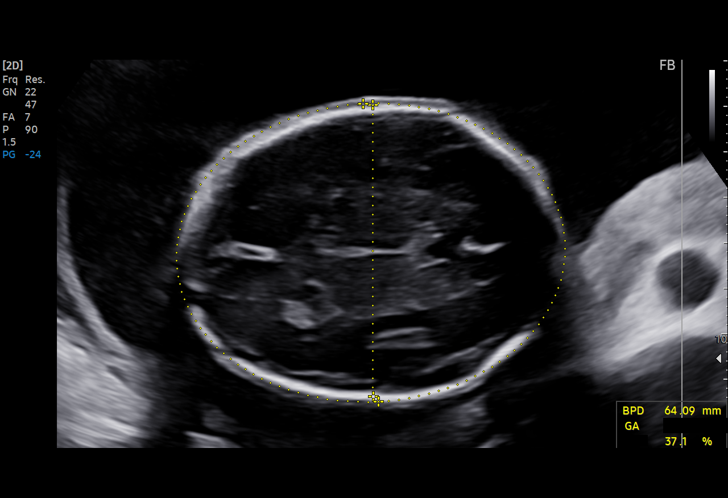
[im 27/34]
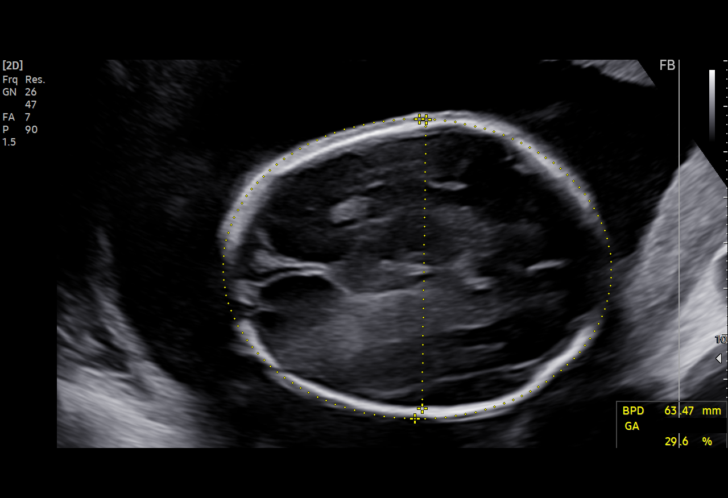
[im 30/34]
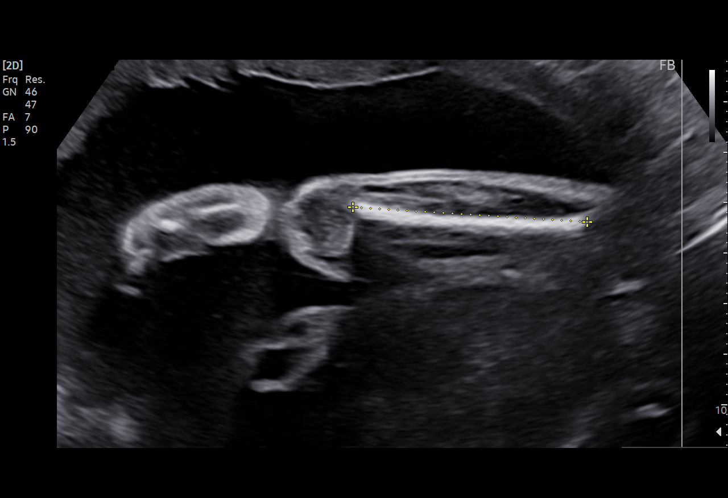
[im 32/34]
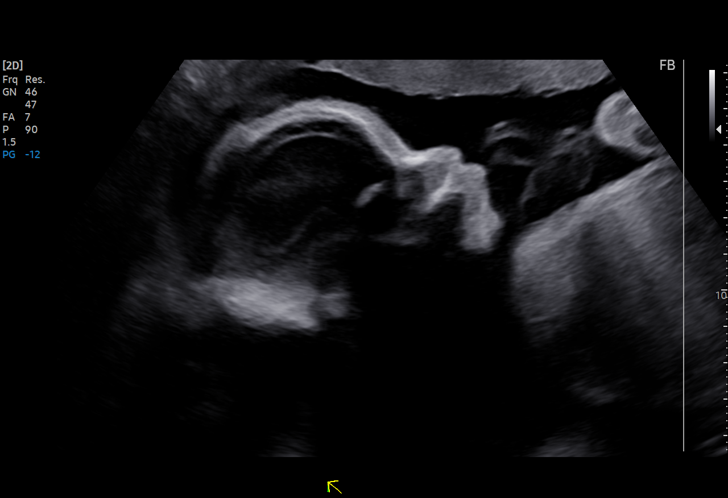

[13 of 28 positions shown; findings below may reference images not displayed]

Indications

 Advanced maternal age multigravida 35+,
 second trimester
 Poor obstetric history: Previous IUFD (31
 weeks; Trisomy 18)
 Poor obstetric history: Previous
 preeclampsia / eclampsia/gestational HTN
 LR-NIPS/AFP-Neg/Horizon-Neg
 26 weeks gestation of pregnancy
 Encounter for other antenatal screening
 follow-up
Fetal Evaluation

 Num Of Fetuses:          1
 Fetal Heart Rate(bpm):   144
 Cardiac Activity:        Observed
 Presentation:            Transverse, head to maternal right
 Placenta:                Anterior
 P. Cord Insertion:       Previously Visualized

 Amniotic Fluid
 AFI FV:      Within normal limits

                             Largest Pocket(cm)

Biometry
 BPD:     63.82  mm     G. Age:  25w 6d         34  %    CI:        72.86   %    70 - 86
                                                         FL/HC:       19.7  %    18.6 -
 HC:    237.71   mm     G. Age:  25w 6d         20  %    HC/AC:       1.10       1.04 -
 AC:        216  mm     G. Age:  26w 1d         44  %    FL/BPD:      73.5  %    71 - 87
 FL:      46.91  mm     G. Age:  25w 4d         25  %    FL/AC:       21.7  %    20 - 24

 Est. FW:     865   gm    1 lb 15 oz     34  %
OB History

 Gravidity:    4         Term:   2        Prem:   1        SAB:   0
 TOP:          0       Ectopic:  0        Living: 2
Gestational Age

 LMP:           26w 0d        Date:  01/18/21                 EDD:   10/25/21
 U/S Today:     25w 6d                                        EDD:   10/26/21
 Best:          26w 0d     Det. By:  LMP  (01/18/21)          EDD:   10/25/21
Anatomy

 Cranium:               Appears normal         LVOT:                   Previously seen
 Cavum:                 Appears normal         Aortic Arch:            Previously seen
 Ventricles:            Previously seen        Ductal Arch:            Previously seen
 Choroid Plexus:        Previously seen        Diaphragm:              Appears normal
 Cerebellum:            Previously seen        Stomach:                Appears normal, left
                                                                       sided
 Posterior Fossa:       Previously seen        Abdomen:                Appears normal
 Nuchal Fold:           Not applicable (>20    Abdominal Wall:         Previously seen
                        wks GA)
 Face:                  Orbits and profile     Cord Vessels:           Previously seen
                        previously seen
 Lips:                  Previously seen        Kidneys:                Appear normal
 Palate:                Not well visualized    Bladder:                Appears normal
 Thoracic:              Appears normal         Spine:                  Previously seen
 Heart:                 Appears normal         Upper Extremities:      Previously seen
                        (4CH, axis, and
                        situs)
 RVOT:                  Previously seen        Lower Extremities:      Previously seen

 Other:  VC, 3VV and 3VTV previously visualized. Nasal bone, lenses,
         maxilla, mandible and falx previously visualized. Heels/feet and open
         hands/5th digits previously visualized. Fetus appears to be female.
Cervix Uterus Adnexa

 Cervix
 Length:            4.4  cm.
 Normal appearance by transabdominal scan.

 Adnexa
 No abnormality visualized.
Impression
 Follow up growth due to AMA (41 yo at time of birth).
 Normal interval growth with measurements consistent with
 dates
 Good fetal movement and amniotic fluid volume
Recommendations

 Follow up growth in 6 weeks.
